# Patient Record
Sex: Female | Born: 1955 | Race: White | Hispanic: No | Smoking: Never smoker
Health system: Southern US, Community
[De-identification: ages and names within clinical notes are randomized; demographics above are authoritative.]

## PROBLEM LIST (undated history)

## (undated) DIAGNOSIS — G4700 Insomnia, unspecified: Secondary | ICD-10-CM

## (undated) DIAGNOSIS — R911 Solitary pulmonary nodule: Secondary | ICD-10-CM

## (undated) DIAGNOSIS — N879 Dysplasia of cervix uteri, unspecified: Secondary | ICD-10-CM

## (undated) DIAGNOSIS — J309 Allergic rhinitis, unspecified: Secondary | ICD-10-CM

## (undated) DIAGNOSIS — E7800 Pure hypercholesterolemia, unspecified: Secondary | ICD-10-CM

## (undated) DIAGNOSIS — N3010 Interstitial cystitis (chronic) without hematuria: Secondary | ICD-10-CM

## (undated) DIAGNOSIS — K589 Irritable bowel syndrome without diarrhea: Secondary | ICD-10-CM

## (undated) DIAGNOSIS — K449 Diaphragmatic hernia without obstruction or gangrene: Secondary | ICD-10-CM

## (undated) DIAGNOSIS — A4902 Methicillin resistant Staphylococcus aureus infection, unspecified site: Secondary | ICD-10-CM

## (undated) DIAGNOSIS — R8789 Other abnormal findings in specimens from female genital organs: Secondary | ICD-10-CM

## (undated) DIAGNOSIS — K259 Gastric ulcer, unspecified as acute or chronic, without hemorrhage or perforation: Secondary | ICD-10-CM

## (undated) DIAGNOSIS — K219 Gastro-esophageal reflux disease without esophagitis: Secondary | ICD-10-CM

## (undated) DIAGNOSIS — K635 Polyp of colon: Secondary | ICD-10-CM

## (undated) DIAGNOSIS — Z860100 Personal history of colon polyps, unspecified: Secondary | ICD-10-CM

## (undated) DIAGNOSIS — I10 Essential (primary) hypertension: Secondary | ICD-10-CM

## (undated) DIAGNOSIS — G5600 Carpal tunnel syndrome, unspecified upper limb: Secondary | ICD-10-CM

## (undated) DIAGNOSIS — N3946 Mixed incontinence: Secondary | ICD-10-CM

## (undated) HISTORY — DX: Dysplasia of cervix uteri, unspecified: N87.9

## (undated) HISTORY — DX: Diaphragmatic hernia without obstruction or gangrene: K44.9

## (undated) HISTORY — DX: Carpal tunnel syndrome, unspecified upper limb: G56.00

## (undated) HISTORY — DX: Insomnia, unspecified: G47.00

## (undated) HISTORY — DX: Other abnormal findings in specimens from female genital organs: R87.89

## (undated) HISTORY — DX: Mixed incontinence: N39.46

## (undated) HISTORY — DX: Pure hypercholesterolemia, unspecified: E78.00

## (undated) HISTORY — DX: Solitary pulmonary nodule: R91.1

## (undated) HISTORY — DX: Personal history of colon polyps, unspecified: Z86.0100

## (undated) HISTORY — DX: Interstitial cystitis (chronic) without hematuria: N30.10

## (undated) HISTORY — DX: Essential (primary) hypertension: I10

## (undated) HISTORY — DX: Gastro-esophageal reflux disease without esophagitis: K21.9

## (undated) HISTORY — DX: Irritable bowel syndrome, unspecified: K58.9

## (undated) HISTORY — DX: Gastric ulcer, unspecified as acute or chronic, without hemorrhage or perforation: K25.9

## (undated) HISTORY — DX: Allergic rhinitis, unspecified: J30.9

## (undated) HISTORY — DX: Personal history of colonic polyps: Z86.010

## (undated) HISTORY — PX: TONSILLECTOMY AND ADENOIDECTOMY: SHX28

## (undated) HISTORY — DX: Methicillin resistant Staphylococcus aureus infection, unspecified site: A49.02

## (undated) HISTORY — PX: CARPAL TUNNEL RELEASE: SHX101

## (undated) HISTORY — DX: Polyp of colon: K63.5

---

## 2000-03-07 HISTORY — PX: ABDOMINAL HYSTERECTOMY: SHX81

## 2000-12-21 ENCOUNTER — Encounter (INDEPENDENT_AMBULATORY_CARE_PROVIDER_SITE_OTHER): Payer: Self-pay

## 2000-12-22 ENCOUNTER — Inpatient Hospital Stay (HOSPITAL_COMMUNITY): Admission: RE | Admit: 2000-12-22 | Discharge: 2000-12-23 | Payer: Self-pay | Admitting: Obstetrics and Gynecology

## 2002-03-21 ENCOUNTER — Encounter: Payer: Self-pay | Admitting: Emergency Medicine

## 2002-03-21 ENCOUNTER — Emergency Department (HOSPITAL_COMMUNITY): Admission: EM | Admit: 2002-03-21 | Discharge: 2002-03-21 | Payer: Self-pay | Admitting: Emergency Medicine

## 2002-05-03 ENCOUNTER — Ambulatory Visit (HOSPITAL_BASED_OUTPATIENT_CLINIC_OR_DEPARTMENT_OTHER): Admission: RE | Admit: 2002-05-03 | Discharge: 2002-05-03 | Payer: Self-pay | Admitting: Urology

## 2004-03-07 DIAGNOSIS — K635 Polyp of colon: Secondary | ICD-10-CM

## 2004-03-07 HISTORY — PX: COLONOSCOPY: SHX174

## 2004-03-07 HISTORY — DX: Polyp of colon: K63.5

## 2004-03-07 LAB — HM COLONOSCOPY

## 2004-09-08 ENCOUNTER — Encounter: Admission: RE | Admit: 2004-09-08 | Discharge: 2004-09-08 | Payer: Self-pay | Admitting: Obstetrics and Gynecology

## 2004-09-16 ENCOUNTER — Encounter: Admission: RE | Admit: 2004-09-16 | Discharge: 2004-09-16 | Payer: Self-pay | Admitting: Obstetrics and Gynecology

## 2005-02-14 ENCOUNTER — Emergency Department (HOSPITAL_COMMUNITY): Admission: EM | Admit: 2005-02-14 | Discharge: 2005-02-14 | Payer: Self-pay | Admitting: Family Medicine

## 2005-03-17 ENCOUNTER — Ambulatory Visit: Payer: Self-pay | Admitting: Gastroenterology

## 2005-03-24 ENCOUNTER — Ambulatory Visit: Payer: Self-pay | Admitting: Internal Medicine

## 2005-04-25 ENCOUNTER — Ambulatory Visit: Payer: Self-pay | Admitting: Internal Medicine

## 2005-06-23 ENCOUNTER — Ambulatory Visit: Payer: Self-pay | Admitting: Internal Medicine

## 2005-08-19 ENCOUNTER — Emergency Department (HOSPITAL_COMMUNITY): Admission: EM | Admit: 2005-08-19 | Discharge: 2005-08-19 | Payer: Self-pay | Admitting: Emergency Medicine

## 2005-08-26 ENCOUNTER — Ambulatory Visit: Payer: Self-pay | Admitting: Cardiology

## 2005-08-30 ENCOUNTER — Ambulatory Visit: Payer: Self-pay | Admitting: Internal Medicine

## 2005-09-01 ENCOUNTER — Ambulatory Visit: Payer: Self-pay

## 2005-09-08 ENCOUNTER — Encounter: Payer: Self-pay | Admitting: Cardiology

## 2005-09-08 ENCOUNTER — Ambulatory Visit: Payer: Self-pay

## 2005-09-12 ENCOUNTER — Ambulatory Visit: Payer: Self-pay | Admitting: Internal Medicine

## 2006-09-04 ENCOUNTER — Ambulatory Visit (HOSPITAL_BASED_OUTPATIENT_CLINIC_OR_DEPARTMENT_OTHER): Admission: RE | Admit: 2006-09-04 | Discharge: 2006-09-04 | Payer: Self-pay | Admitting: *Deleted

## 2007-11-29 ENCOUNTER — Ambulatory Visit: Payer: Self-pay | Admitting: Cardiology

## 2007-11-30 ENCOUNTER — Inpatient Hospital Stay (HOSPITAL_COMMUNITY): Admission: EM | Admit: 2007-11-30 | Discharge: 2007-12-03 | Payer: Self-pay | Admitting: Emergency Medicine

## 2007-11-30 ENCOUNTER — Ambulatory Visit: Payer: Self-pay | Admitting: Cardiology

## 2007-12-05 ENCOUNTER — Ambulatory Visit: Payer: Self-pay | Admitting: Internal Medicine

## 2007-12-05 DIAGNOSIS — K219 Gastro-esophageal reflux disease without esophagitis: Secondary | ICD-10-CM | POA: Insufficient documentation

## 2007-12-05 DIAGNOSIS — K222 Esophageal obstruction: Secondary | ICD-10-CM

## 2007-12-10 ENCOUNTER — Ambulatory Visit: Payer: Self-pay | Admitting: Internal Medicine

## 2008-05-27 ENCOUNTER — Ambulatory Visit: Payer: Self-pay | Admitting: Family Medicine

## 2008-10-08 ENCOUNTER — Encounter: Admission: RE | Admit: 2008-10-08 | Discharge: 2008-12-03 | Payer: Self-pay | Admitting: Family Medicine

## 2009-01-06 ENCOUNTER — Ambulatory Visit: Payer: Self-pay | Admitting: Family Medicine

## 2009-02-04 LAB — HM MAMMOGRAPHY: HM Mammogram: NORMAL

## 2009-03-09 ENCOUNTER — Encounter: Admission: RE | Admit: 2009-03-09 | Discharge: 2009-03-09 | Payer: Self-pay | Admitting: Family Medicine

## 2009-10-21 IMAGING — CR DG CHEST 2 VIEW
2 series · 2 of 2 positions shown · non-contrast
Comparison: None

CLINICAL DATA: Chest and left arm pain, shortness of breath,
weakness, hypertension

CHEST - 2 VIEW

[w chest pa]
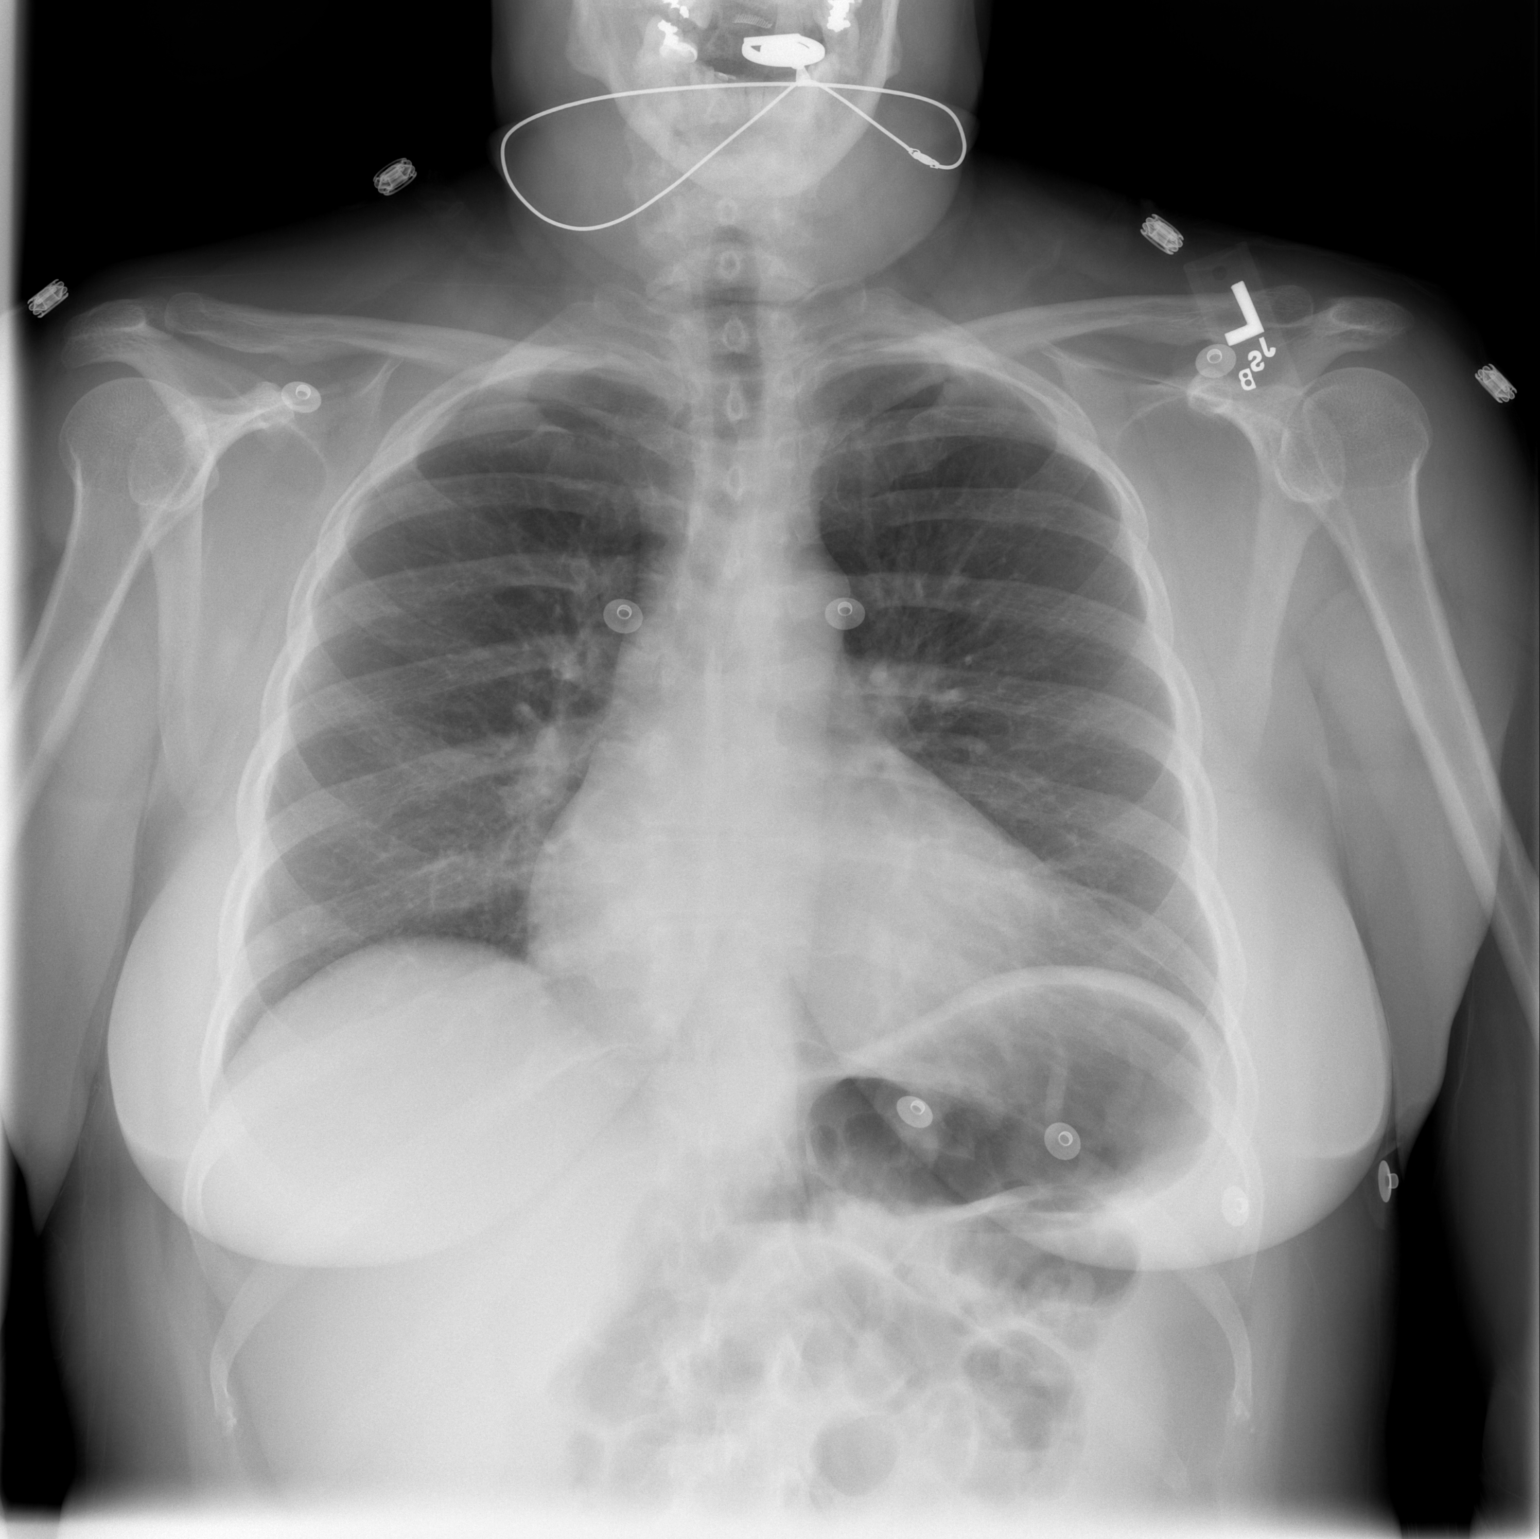

[w chest lat]
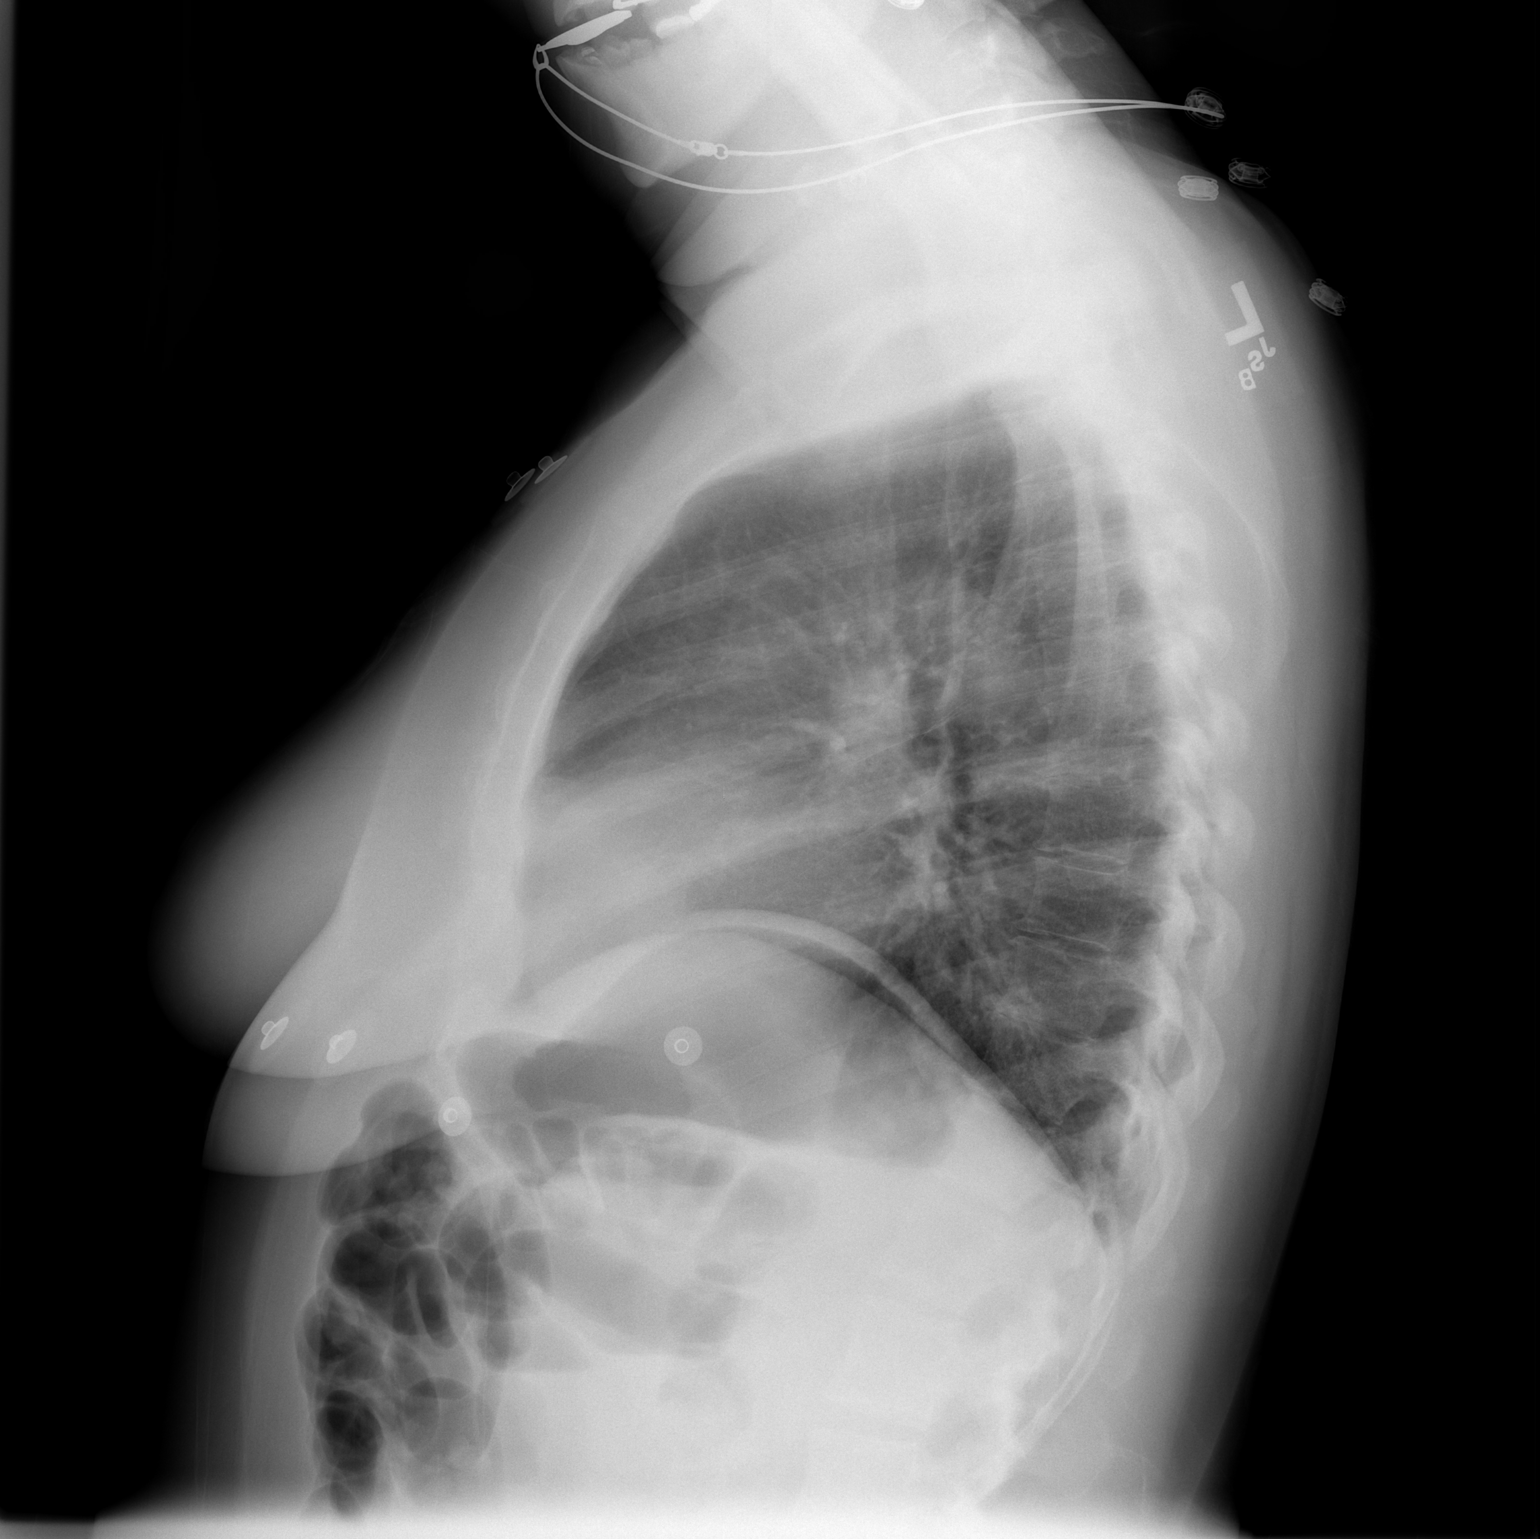

[2 of 2 positions shown; findings below may reference images not displayed]

FINDINGS: Mild cardiac enlargement.
Normal mediastinal contours and pulmonary vascularity.
Lungs clear.
No pleural effusion or pneumothorax.
Bones unremarkable.
IMPRESSION: Mild cardiomegaly.
No acute abnormalities.

## 2010-03-28 ENCOUNTER — Encounter: Payer: Self-pay | Admitting: Internal Medicine

## 2010-03-28 ENCOUNTER — Encounter: Payer: Self-pay | Admitting: Obstetrics and Gynecology

## 2010-03-30 ENCOUNTER — Ambulatory Visit: Payer: Self-pay | Admitting: Cardiology

## 2010-04-07 ENCOUNTER — Encounter: Payer: Self-pay | Admitting: Family Medicine

## 2010-06-28 ENCOUNTER — Other Ambulatory Visit: Payer: Self-pay | Admitting: *Deleted

## 2010-06-28 DIAGNOSIS — I10 Essential (primary) hypertension: Secondary | ICD-10-CM

## 2010-06-28 MED ORDER — LISINOPRIL-HYDROCHLOROTHIAZIDE 20-12.5 MG PO TABS: 1.0000 | ORAL_TABLET | Freq: Every day | ORAL | Status: DC

## 2010-06-28 NOTE — Telephone Encounter (Signed)
escribe medication per fax request  

## 2010-07-20 NOTE — Cardiovascular Report (Signed)
NAMEKAYTELYN, GLORE NO.:  [id number]   MEDICAL RECORD NO.:  [id number]          PATIENT TYPE:  OBV   LOCATION:  3704                         FACILITY:  [facility]   PHYSICIAN:  Verne Carrow, MDDATE OF BIRTH:  [birth year]   DATE OF PROCEDURE:  12/03/2007  DATE OF DISCHARGE:                            CARDIAC CATHETERIZATION   INDICATIONS:  Chest pain.   OPERATOR:  Verne Carrow, MD   PROCEDURE PERFORMED:  1. Left heart catheterization.  2. Selective coronary angiography.  3. Left ventricular angiogram.   FINDINGS:  1. The left main coronary artery bifurcates to the circumflex through      LAD.  There is no disease noted in the left main.  2. The left anterior descending courses to the apex and becomes a      small-caliber vessel near the apex.  There is no angiographic      evidence of disease noted in the LAD.  3. Circumflex had been following gives off several obtuse marginal      branches and is free of disease.  There is moderate size ramus      branch that has no evidence of disease.  4. The right coronary artery is a large dominant vessel that has no      evidence of disease.  5. Left ventricular angiogram shows normal systolic function with no      evidence of wall motion abnormalities.  The ejection fraction is      estimated at 60%.   HEMODYNAMIC FINDINGS:  Left ventricular pressure 140/7, end-diastolic  pressure 17, central aortic pressure 126/64.   DETAILS OF PROCEDURE:  The patient was brought to the Heart  Catheterization Laboratory after signing informed consent for the  procedure.  The right groin was prepped and draped in a sterile fashion.  A 5-French sheath was inserted into the right femoral artery.  Standard  diagnostic catheters were used to engage the left coronary system and  the right coronary artery for selective coronary angiograms.  A 5-French  pigtail catheter was used to cross the aortic valve into the left  ventricle.  Following a left ventricular angiogram, the pigtail catheter  was pulled back across the aortic valve with no significant pressure  gradient measure.  The patient tolerated the procedure well and was  taken to the recovery area where her sheath was removed.  Manual  pressure was applied for hemostasis.   IMPRESSION:  1. No angiographic evidence of coronary artery disease.  2. Normal left ventricular systolic function.  3. Noncardiac etiology of chest pain.   RECOMMENDATIONS:  I do not recommend any further cardiac workup of this  patient's chest pain.  It sounds like it would be most beneficial to  proceed now with a GI workup of her chest pain.      Verne Carrow, MD  Electronically Signed     CM/MEDQ  D:  12/03/2007  T:  12/04/2007  Job:  [id number]

## 2010-07-20 NOTE — H&P (Signed)
Tammy, [name] NO.:  [id number]   Tammy RECORD NO.:  [id number]          PATIENT TYPE:  OBV   LOCATION:  3703                         FACILITY:  [facility]   PHYSICIAN:  Tammy Rotunda, MD, FACCDATE OF BIRTH:  1955-04-17   DATE OF ADMISSION:  11/30/2007  DATE OF DISCHARGE:                              HISTORY & PHYSICAL   PRIMARY CARDIOLOGIST:  [name]. Tammy Som, MD, [name]   PRIMARY CARE PHYSICIAN:  Dr. Reita [name] at [name].   [name] is a delightful 55 year old Caucasian female who I actually  spoke with by phone tonight to the answering service when she called  complaining of chest discomfort.  She had been exercising and  immediately after exercising developed some tightness around her left  breast with numbness down her left arm, some left jaw and neck  discomfort, and her legs felt wobbly.  She had actually been evaluated  in the past and had a stress test done per the patient's report.  I do  not have the report available.  Has seen Dr. Jens [name] a few years ago  with hypertension, mitral valve prolapse/palpitations, and had a stress  test done per the patient's report and then she recently saw Dr. Myrtis [name] at  the request of her primary care physician.  She was experiencing some  chest discomfort last Sunday.  She also had numbness in her left arm and  her left neck.  She saw Dr. Myrtis [name] this week and was scheduled for stress  test at our office on Monday, but tonight after she began to have the  chest discomfort, this alarmed her, and we decided to go ahead and bring  her in and have a stress test done here.  She also had some nausea today  prior to the chest discomfort.  She taken some antacid without relief.  Currently, she is rating her pain as 5 on a scale of 1-10.  She has no  other associated symptoms with chest discomfort.   PAST Tammy HISTORY:  1. Hypertension.  2. Irritable bowel disease.  3. Interstitial cystitis.  4.  Dyslipidemia.  5. Mitral valve prolapse and palpitations.  6. Chest discomfort.  7. Previous stress test per patient's report was okay.   SOCIAL HISTORY:  She lives in [place] with her husband.  She stays at  home and cares for her 43-year-old grandchild.  Denies any tobacco, drug,  or herbal medication use.  She exercises frequently, but states today  was the first time she had exercised in a week.  Tries to follow a heart  healthy diet.   FAMILY HISTORY:  Positive for coronary artery disease, cancer in her  father.  Siblings without known disease.   REVIEW OF SYSTEMS:  Positive for chest pain, nausea, and leg weakness as  described above.  All other systems reviewed and negative.   ALLERGIES:  No known drug allergies.   MEDICATIONS:  1. Metoprolol 25 b.i.d.  2. Hydrochlorothiazide 12.5 daily.  3. Lovastatin 10.  4. Prilosec 20 b.i.d.  5. Multivitamin daily.  6. Aleve p.r.n.  7. Levsin  p.r.n.  8. She takes aspirin 81 mg daily.   PHYSICAL EXAMINATION:  VITAL SIGNS:  Temperature 98.4, heart rate 51 and  regular, respirations 18, and blood pressure 159/92.  GENERAL:  She is in no acute distress, but rating her pain of 5 at this  time.  HEENT:  Unremarkable.  NECK:  Supple without lymphadenopathy.  No bruits.  No JVD.  CARDIOVASCULAR:  Reveals S1 and S2, brady rhythm.  LUNGS:  Clear to auscultation.  SKIN:  Warm and dry.  ABDOMEN:  Soft, nontender.  She has positive bowel sounds.  No rebound  or guarding.  EXTREMITIES:  Lower extremities without clubbing,  cyanosis, or edema.  NEUROLOGIC:  Alert and oriented x3.   Chest x-ray showing mild cardiomegaly.  EKG showing sinus brady at a  rate of 47 without acute ST or T-wave changes.  Lab work baseline.  Point of care is negative.  Potassium 4.1, creatinine 0.8, glucose 97,  hematocrit 39.   The patient with chest pain somewhat typical/atypical features; however,  no objective evidence of ischemia.  No abnormal physical  findings.  Admitted for overnight observation, rule out myocardial infarction.  If  enzymes are negative, schedule exercise Cardiolite in the a.m.  inpatient.  Dr. Rollene [name] has been in to examine and assess, the  patient agrees with plan of care.      [name], ACNP      Tammy Rotunda, MD, [facility]  Electronically Signed    MB/MEDQ  D:  11/30/2007  T:  12/01/2007  Job:  [id number]

## 2010-07-20 NOTE — Discharge Summary (Signed)
NAMEMECKENZIE, BALSLEY NO.:  [id number]   MEDICAL RECORD NO.:  [id number]          PATIENT TYPE:  OBV   LOCATION:  3703                         FACILITY:  [facility]   PHYSICIAN:  Verne Carrow, MDDATE OF BIRTH:  21-Nov-1955   DATE OF ADMISSION:  11/30/2007  DATE OF DISCHARGE:  12/03/2007                               DISCHARGE SUMMARY   PROCEDURES:  1. Adenosine Myoview.  2. Cardiac catheterization.  3. Coronary arteriogram.  4. Left ventriculogram.   PRIMARY FINAL DISCHARGE DIAGNOSIS:  Chest pain, no coronary artery  disease in catheterization.   SECONDARY DIAGNOSES:  1. Hypertension.  2. Irritable bowel syndrome.  3. History of interstitial cystitis.  4. History of dyslipidemia with a total cholesterol of 169,      triglycerides 193, HDL 24, LDL 106 this admission.  5. History of mitral valve prolapse with echocardiogram at 2007      showing trivial mitral valvular regurgitation and normal mitral      valvular stricture, ejection fraction 60-65%, trivial tricuspid      valvular regurgitation also.  6. Obesity with a body mass index of 30.8.  7. Family history of coronary artery disease.   TIME OF DISCHARGE:  31 minutes.   HOSPITAL COURSE:  Ms. Santoni is a 55 year old female with no previous  history of coronary artery disease.  She has been evaluated for chest  pain in the past, having a normal Myoview in 2007.  She was seen in the  office on November 29, 2007 and was scheduled for a stress test.  However, she had recurrent chest pain and came to the hospital where she  was admitted for further evaluation.   Her cardiac enzymes were negative for MI.  An adenosine Myoview was  performed, which showed anterior apical abnormality, felt to be breast  attenuation artifact.  Her EF was 71%.  She was evaluated by Dr.  Dietrich Pates who felt that with recurrent symptoms, cardiac catheterization  was indicated.  This was performed on December 03, 2007.   The cardiac catheterization showed no angiographic evidence of coronary  artery disease.  She had preserved left ventricular function.  Dr.  Clifton James reviewed the films and felt that she had noncardiac chest pain.  He recommended follow up with primary care for noncardiac causes of  chest pain.  Postcath, Ms. Fifer was having no further episodes of  chest pain.  She was having episodes of right hip pain, which she states  is a recurrent problem for her and likely due to osteoarthritis.  Because she needs to avoid NSAIDs for several days after her cath, she  will be placed on Vicodin temporarily and follow up with her primary  care physician, her symptoms did not improve.  Ms. Hutto is pending  completion of her bed rest, but if she ambulates without difficulty  after her bedrest is completed, she will be discharged home on December 03, 2007 in stable condition.  Of note, Ms. Zachery Conch had sinus  bradycardia with heart rates occasionally dropping into the 40s.  There  was concern for this causing some  symptoms and therefore, her beta-  blocker is currently discontinued.  Per the patient, she normally has a  low heart rate and if her heart rate is unchanged, off the beta-blocker,  it can be restarted as an outpatient.   DISCHARGE INSTRUCTIONS:  Her activity level is to be increased gradually  with no driving for 2 days and no lifting for a week.  She is to call  our office for any problems with the cath site.  She is to follow up  with Jacolyn Reedy, PA-C for Dr. Jens Som on December 12, 2007 at 8:45.  She is to follow up with Dr. Katrinka Blazing as needed.  She is to contact Dr.  Marina Goodell with [facility] for an appointment.   DISCHARGE MEDICATIONS:  1. Metoprolol 25 mg b.i.d. is stopped.  2. Lovastatin 10 mg daily.  3. Multivitamin daily.  4. Aspirin 81 mg daily.  5. Motrin p.r.n. is on hold for 48 hours.  6. Ambien 5 mg at bedtime p.r.n.  7. Hydrochlorothiazide 12.5 mg daily.  8. Vicodin 5 mg  1 or 2 tablets t.i.d. p.r.n.      Theodore Demark, PA-C      [facility]ne Carrow, MD  Electronically Signed    RB/MEDQ  D:  12/03/2007  T:  12/03/2007  Job:  [id number]   cc:   Wilhemina Bonito. Marina Goodell, MD  Nilda Simmer, M.D.

## 2010-07-20 NOTE — Assessment & Plan Note (Signed)
[facility]                            CARDIOLOGY OFFICE NOTE   NAME:Goldenstein, Pascal Lux                       MRN:          [id number]  DATE:11/29/2007                            DOB:          1955/04/09    Ms. Wigger is here for cardiology follow-up.  There is no history of  documented coronary disease.  She has had some hypertension.  She saw  Dr. Jens Som last on August 26, 2005.  At that time she appeared to be  stable.  She had a Myoview scan at that time.  The scan showed no  significant abnormalities.  Over the past few days,she has had some  nausea and some chest discomfort.  She had some tingling in her arm.  She was seen at Urgent Care and then in the emergency room.  I do not  have the lab results.  It is my understanding that they were drawn  through urgent care.  In the emergency room, she was felt to be stable.  She is now here for further follow-up.  She has some vague chest  discomfort.  It is not related to inspiration.  She might have slight  tenderness in her chest.   PAST MEDICAL HISTORY:   ALLERGIES:  No known drug allergies.   MEDICATIONS:  Hydrochlorothiazide, aspirin, metoprolol, omeprazole,  lovastatin, and multivitamin.   OTHER MEDICAL PROBLEMS:  See the list below.   REVIEW OF SYSTEMS:  Today, she mentions some slight lightheadedness and  rare palpitations.  She has vague chest discomfort.  Otherwise, her  review of systems is negative.   PHYSICAL EXAMINATION:  Weight is 158 pounds.  Blood pressure is 132/80  with a pulse of 50.  The patient is here with her husband today.  The  patient is oriented to person, time, and place.  Affect is normal.  HEENT reveals no xanthelasma.  She has normal extraocular motion.  There  are no carotid bruits.  There is no jugular venous distention.  Lungs  are clear.  Respiratory effort is not labored.  Cardiac exam reveals an  S1 with an S2.  She has no clicks or significant murmurs.  Her  abdomen  is soft.  She has no significant peripheral edema.   EKG today reveals sinus bradycardia with a rate of 50 and no significant  ST-T wave changes.   Problems include,  1. History of gastroesophageal reflux disease with erosive esophagitis      and peptic strictures in the past.  She has seen Dr. Yancey Flemings      over time.  She is on omeprazole.  She may need further GI      evaluation over time.  2. History of nonsteroidal anti-inflammatory drug-induced ulcer      disease.  3. Irritable bowel syndrome.  4. History of palpitations in the past.  5. History of mitral valve prolapse at some point in the past.  I do      not have recent echo data.  6. History of bradycardia and she still has bradycardia.  7. Current vague chest discomfort.  Etiology is not clear.  She is not      having an acute cardiac event.   We will arrange for a Myoview scan over the next few days and then early  follow-up with Dr. Jens Som.  I will not be changing any of her meds at  this point.     Luis Abed, MD, [facility]  Electronically Signed    JDK/MedQ  DD: 11/29/2007  DT: 11/29/2007  Job #: [id number]   cc:   [facility] [place]

## 2010-07-20 NOTE — Op Note (Signed)
[name], [name] NO.:  [id number]   MEDICAL RECORD NO.:  [id number]           PATIENT TYPE:   LOCATION:                                 FACILITY:   PHYSICIAN:  Tammy [name], M.D.DATE OF BIRTH:   DATE OF PROCEDURE:  09/04/2006  DATE OF DISCHARGE:                               OPERATIVE REPORT   PREOP DIAGNOSIS:  Right carpal tunnel syndrome.   POSTOP DIAGNOSIS:  Right carpal tunnel syndrome.   PROCEDURE:  Decompression median nerve right carpal tunnel.   SURGEON:  Lowell Bouton, M.D.   ANESTHESIA:  Marcaine 1/2%, local with sedation.   OPERATIVE FINDINGS:  The patient had no masses in the carpal canal.  The  motor branch of the nerve was intact.   DESCRIPTION OF PROCEDURE:  Under 1/2% Marcaine local anesthesia with a  tourniquet on the right arm, the right hand was prepped and draped in  the usual fashion.  After exsanguinating the limb the tourniquet was  inflated to 250 mmHg.  A 3-cm, longitudinal incision was made in the  palm just ulnar to the thenar crease; then carried through the  subcutaneous tissues.  Blunt dissection was carried through the  superficial palmar fascia distal to the transverse carpal ligament.  A  hemostat was then placed in the carpal canal up against the hook of the  hamate; and the transverse carpal ligament was divided on the ulnar  border of the median nerve.  The proximal end of the ligament was  divided with the scissors after dissecting the nerve away from the  undersurface of the ligament.  The carpal canal was then palpated and  was found to be adequately decompressed.  The nerve was examined; and  the motor branch was identified.   The wound was then irrigated copiously with saline.  The skin was closed  with 4-0 nylon sutures.  Sterile dressings were applied followed by a  volar wrist splint.  The patient tolerated the procedure well and went  to the recovery room; awake and stable in stable  condition.      Lowell Bouton, M.D.  Electronically Signed     EMM/MEDQ  D:  09/04/2006  T:  09/04/2006  Job:  [id number]

## 2010-07-23 NOTE — Discharge Summary (Signed)
[facility]  Patient:    Tammy [name], Tammy [name] Visit Number: [id number] MRN: [id number]          Service Type: DSU Location: [id number] Attending Physician:  Michaele Offer Dictated by:   Zenaida Niece, M.D. Admit Date:  12/21/2000 Discharge Date: 12/23/2000                             Discharge Summary  ADMISSION DIAGNOSES:          1. Abnormal uterine bleeding.                               2. Endometrial lesion.                               3. Leiomyomatous uterus.  DISCHARGE DIAGNOSES:          1. Leiomyomatous uterus.                               2. Simple endometrial hyperplasia.                               3. Benign endometrial polyp.  PROCEDURE:                    Transvaginal hysterectomy.  CONSULTATIONS:                None.  COMPLICATIONS:                None.  HISTORY AND PHYSICAL:         Please see the chart for the full history and physical.  Briefly, this is a 55 year old white female, para 2-0-0-2, with regular menses but with intermenstrual bleeding and postcoital bleeding with known fibroids associated with dyspareunia and some abdominal pain. Ultrasound reveals an endometrial lesion and she desires definitive therapy.  Physical exam was significant for a benign abdomen and on pelvic exam, she had a slightly enlarged irregular uterus, mostly on the right, consistent with fibroids.  HOSPITAL COURSE:              Patient was admitted on October 17th and underwent a vaginal hysterectomy with cystoscopy under general anesthesia. Estimated blood loss was 200 cc.  She had an enlarged uterus consistent with leiomyomas and normal tubes and ovaries.  By cystoscopy, she had patent ureters.  Postoperatively, she did fairly well.  She felt very weak on postoperative day #1 and felt she could not go home due to this.  She had no nausea and vomiting and good pain control.  Preoperative hemoglobin was 12.2, postoperative  was 9.3, which rose to 9.9 on the afternoon of postoperative day #1.  She was able to ambulate and tolerated a regular diet.  On the morning of postoperative day #2, she was felt to be stable enough for discharge home.  CONDITION ON DISCHARGE:       Stable.  DISPOSITION:                  Discharged to home.  DISCHARGE INSTRUCTIONS:       Her diet is a regular diet, her activity is pelvic rest, no strenuous activity and no driving.  FOLLOWUP:                     Followup is in six weeks. DISCHARGE MEDICATIONS:        Percocet p.r.n. pain and over-the-counter Aleve prn. Dictated by:   Zenaida Niece, M.D. Attending Physician:  Michaele Offer DD:  12/23/00 TD:  12/26/00 Job: [id number]

## 2010-07-23 NOTE — H&P (Signed)
[facility]  Patient:    Tammy [name], Tammy [name] Visit Number: [id number] MRN: [id number]          Service Type: Attending:  Zenaida Niece, M.D. Dictated by:   Zenaida Niece, M.D. Adm. Date:  12/21/00                           History and Physical  CHIEF COMPLAINT:              Abnormal uterine bleeding with intermenstrual bleeding and postcoital bleeding, dyspareunia, and leiom1yomatous uterus.  HISTORY OF PRESENT ILLNESS:   This is a 55 year old white female, gravida 2, para 2-0-0-2, whom I saw on August 21. The patient said that she has regular menses every month with occasional intermenstrual bleeding and postcoital bleeding at least every month. She has known fibroids and has had the abnormal bleeding for at least three or four months. She also complained of deep dyspareunia which was worse with rear entry and varied. She says that she can have some extreme abdominal pain with bowel movements and that this is worse during her menses. Exam revealed a slightly enlarged, irregular uterus, mostly on the right, consistent with leiomyomas. On September 3 she had a pelvic ultrasound which revealed a slightly enlarged uterus with a 3.7 x 3 cm posterior leiomyoma which was tender. The right ovary was normal, left ovary was not seen. With saline infusion, there was noted to be a small fundal endometrial lesion consistent with a polyp. She had colposcopy performed at the same time due to a history of abnormal Pap smears, most recently being ASCUS in July of 2002 with negative human papillomavirus. Colposcopy revealed some acetowhite areas but biopsies revealed only squamous metaplasia with acute and chronic cervicitis and reactive epithelial changes. She was treated with MetroGel for this. Due to the patients abnormal bleeding and fibroids, she wishes to proceed with definitive surgery and is admitted for this at this time.  PAST OBSTETRICAL HISTORY:      In 1978 and 1980, vaginal deliveries at term, 6 pounds 9 ounces and 7 pounds 0 ounces without complications.  PAST MEDICAL HISTORY:         She has mitral valve prolapse with mitral regurgitation and tricuspid regurgitation. She has interstitial cystitis, a history of peptic ulcer disease, hemorrhoids, and irritable bowel syndrome.  PAST SURGICAL HISTORY:        Tonsillectomy and adenoidectomy, ganglion cyst removal from her wrist and wisdom teeth removal.  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  ALLERGIES:                    None known.  MEDICATIONS:                  Zantac q.d. to b.i.d. as needed, Restoril p.r.n. and occasional Xanax p.r.n.  FAMILY HISTORY:               No gynecological or colon cancer.  SOCIAL HISTORY:               Patient is married and denies alcohol, tobacco, or drug use.  PHYSICAL EXAMINATION:  VITAL SIGNS:                  Weight is 148 pounds. Pulse is 70, blood pressure 142/80.  GENERAL:  Well-developed, well-nourished white female who is in no acute distress.  HEENT:                        Pupils are equally round and reactive to light and accommodation. Extraocular muscles are intact. Oropharynx is clear without erythema or exudates.  NECK:                         Supple without lymphadenopathy or thyromegaly.  LUNGS:                        Clear to auscultation.  HEART:                        Regular rate and rhythm without murmur.  BREAST:                       Breast exam in the sitting and supine position reveals no dominant masses, adenopathies, skin change, or nipple discharge.  ABDOMEN:                      Soft, nontender, nondistended without palpable masses.  EXTREMITIES:                  No edema, are nontender, and DTRS are 2/4 and symmetric.  PELVIC:                       External genitalia reveals no  lesions. Speculum exam reveals  no lesions and a normal cervix. Again, on bimanual exam she has a slightly enlarged irregular uterus which is mostly on the right consistent with leiomyomas. She has no adnexal masses.  ASSESSMENT:                   Abnormal uterine bleeding with an endometrial lesion and a uterine leiomyoma. The patient wishes to proceed with definitive surgical therapy. Risks of surgery including bleeding, infection, and damage to the surrounding organs have been discussed with the patient and she agrees to proceed. The patient wishes to preserve her ovaries unless they abnormal.  PLAN:                         Admit the patient on the day of surgery for a vaginal hysterectomy with possible bilateral salpingo-oophorectomy and cystoscopy. Dictated by:   Zenaida Niece, M.D. Attending:  Zenaida Niece, M.D. DD:  12/20/00 TD:  12/20/00 Job: [id number]

## 2010-07-23 NOTE — Op Note (Signed)
NAME:  Tammy [name], Tammy [name]                          ACCOUNT NO.:  [id number]   MEDICAL RECORD NO.:  [id number]                   PATIENT TYPE:  AMB   LOCATION:  [facility]                                 FACILITY:  [facility]   PHYSICIAN:  Jamison Neighbor, M.D.               DATE OF BIRTH:  [birth year]   DATE OF PROCEDURE:  05/03/2002  DATE OF DISCHARGE:                                 OPERATIVE REPORT   PREOPERATIVE DIAGNOSIS:  Interstitial cystitis.   POSTOPERATIVE DIAGNOSIS:  Interstitial cystitis.   PROCEDURE:  1. Cystoscopy.  2. Urethral calibration.  3. Hydrodistention of the bladder.  4. Marcaine and Pyridium instillation.  5. Marcaine and Kenalog injection.   SURGEON:  Jamison Neighbor, M.D.   ANESTHESIA:  General.   COMPLICATIONS:  None.   DRAINS:  None.   BRIEF HISTORY:  This 55 year old female is felt to have suffered from  interstitial cystitis.  Dr. __________made a diagnosis many years ago.  The  patient was evaluated with cystoscopy under anesthesia and underwent  Clorpactin instillation, and she did reasonably well until recently when her  symptoms returned.  The patient has increasing urgency, frequency, and pain  and generally feels that she is ill and out of sorts.  The patient has no  real evidence for infection and has not responded to antibiotic therapy.  The patient also notes that she has had some problem with stress  incontinence and has been told that she might need a pubovaginal sling.  We  note that the incontinence predates the hysterectomy, but a urodynamic  evaluation to determine if this is true stress or urge incontinence has not  been obtained.  The patient is interested in undergoing diagnostic and  therapeutic cystoscopy to see if she will have some improvement in her  symptoms and to assess her interstitial cystitis.  Pending results of that  therapy, she can be started on appropriate medical management.  Decision as  to whether she should undergo  surgery for stress incontinence can be made  following this cystoscopy and after urodynamic assessment.  The patient  understands the risks and benefits of the procedure and gave full and  informed consent.   DESCRIPTION OF PROCEDURE:  After the successful induction of general  anesthesia, the patient was placed in the dorsal lithotomy position, prepped  with Betadine, and draped in the usual sterile fashion.  Bilateral pelvic  exam reveals minimal cystocele, slight urethral immobility, no significant  rectocele, and no masses on bimanual exam.  There was no evidence of  urethral diverticulum or other abnormalities of the urethra.  The urethra  was calibrated to 22 [place] with female urethral sounds with no evidence of  stenosis or stricture.  The cystoscope was inserted, and the bladder was  carefully inspected.  It was free of any tumor or stones.  Both ureteral  orifices were normal in configuration and location.  There was a modest  degree of physiologic squamous metaplasia which was completely unremarkable.  Urine coming from each ureter was clear.  Distention of the bladder at 100  cm of water was performed for five minutes.  When the bladder was drained,  the bladder capacity was approximately 475 mL which is markedly diminished.  There was terminal bleeding and ulcer formation consistent with interstitial  cystitis.  True, full-fledged Hunner's ulcers were not really seen.  The  bladder was drained.  A mixture of Marcaine and Pyridium was left within the  bladder.  Marcaine and Kenalog were injected periurethrally.  The patient  had received a B&O suppository at the initiation of the procedure and also  received intraoperative Toradol and Zofran in trying to cut down on  postoperative pain.  She will be sent home with Lorcet Plus, Pyridium Plus,  Ditropan, and Levaquin and will return to the office follow-up.  Once that  she has returned, we will offer her instillation therapy  and/or oral therapy  which will consist of Elmiron, Atarax, and possible use of low dose Elavil  or Neurontin.  The patient will make a decision as to whether she needs any  further evaluation of her incontinence once she has been started on IC type  therapy.                                               Jamison Neighbor, M.D.    RJE/MEDQ  D:  05/03/2002  T:  05/03/2002  Job:  [id number]   cc:   Zenaida Niece, M.D.  [address]  [place]  [place] [address]  Fax: [phone]

## 2010-07-23 NOTE — Op Note (Signed)
[facility]  Patient:    Tammy [name], Tammy [name] Visit Number: [id number] MRN: [id number]          Service Type: DSU Location: [id number] Attending Physician:  Michaele Offer Proc. Date: 12/21/00 Admit Date:  12/21/2000 Discharge Date: 12/23/2000                             Operative Report  PREOPERATIVE DIAGNOSIS:       Abnormal uterine bleeding.  Leiomyomatous uterus.  POSTOPERATIVE DIAGNOSIS:      Abnormal uterine bleeding.  Leiomyomatous uterus.  OPERATION:                    Transvaginal hysterectomy and cystoscopy.  SURGEON:                      Zenaida Niece, M.D.  ASSISTANT:                    Malachi Pro. Ambrose Mantle, M.D.  ANESTHESIA:                   General endotracheal tube.  ESTIMATED BLOOD LOSS:         200 cc.  FINDINGS:                     Enlarged uterus consistent with leiomyomas with normal tubes and ovaries and she had patent ureters by cystoscopy.  DESCRIPTION OF PROCEDURE:     The patient was taken to the operating room and placed in the dorsal supine position.  General anesthesia was induced and she was placed in mobile stirrups.  Perineum and vagina were then prepped and draped in the usual sterile fashion and her bladder drained with a red rubber catheter.  A weighted speculum was inserted into the vagina and the cervix was grasped with Christella Hartigan tenaculum.  The cervicovaginal mucosa was then infiltrated with a dilute solution of Pitressin.  The cervicovaginal mucosa was then incised with electrocautery.  The posterior cul-de-sac was grasped and entered sharply and a Bonanno speculum placed in the posterior cul-de-sac.  The bladder was then dissected off anteriorly, the anterior peritoneum identified and entered.  A Deaver retractor was then used to retract the bladder anteriorly.  The uterosacral ligaments were clamped, transected, and ligated with #1 chromic and tagged for later use.  The uterine arteries,  cardinal ligaments, and broad ligaments were also clamped, transected, and ligated with #1 chromic.  The uterus with some difficulty was delivered through the incision and the utero-ovarian pedicles were clamped, transected, and doubly ligated with #1 chromic and tagged for observation.  Both tubes and ovaries were inspected and found to be normal.  All sites were inspected and found to be hemostatic.  The uterosacral ligaments were then plicated in the midline with one interrupted suture of 2-0 silk.  The peritoneum was run to the vaginal cuff with running locking 0 Vicryl to aid in hemostasis.  The previously tagged uterosacral pedicles were then also tied in the midline for added support.  The vagina was then closed with a running locking 2-0 Vicryl. The patient was given Indigo-Carmine IV.  The vaginal cuff was found to be hemostatic and closed adequately.  The bladder was drained and 200 cc of water was instilled.  Inspection with the cystoscope then revealed patent ureters bilaterally with Indigo Carmine seen to come from both ureteral  orifices. There was no injury to the bladder.  The cystoscope was removed and a Foley catheter inserted and the bladder drained.  The patient was taken down from stirrups.  She was extubated in the operating room, tolerated the procedure well, and was taken to the recovery room in stable condition.  The patient was given Ancef 1 gram prophylaxis and counts were correct x 2. Attending Physician:  Michaele Offer DD:  01/06/01 TD:  01/08/01 Job: [id number]

## 2010-08-30 ENCOUNTER — Other Ambulatory Visit: Payer: Self-pay | Admitting: *Deleted

## 2010-08-30 DIAGNOSIS — E785 Hyperlipidemia, unspecified: Secondary | ICD-10-CM

## 2010-08-31 ENCOUNTER — Other Ambulatory Visit: Payer: Self-pay | Admitting: *Deleted

## 2010-08-31 ENCOUNTER — Telehealth: Payer: Self-pay | Admitting: Cardiology

## 2010-08-31 DIAGNOSIS — E7800 Pure hypercholesterolemia, unspecified: Secondary | ICD-10-CM

## 2010-08-31 NOTE — Telephone Encounter (Signed)
Called today really confused about what her and her husband are supposed to do next.  The last time they were both here was on March 29, 2010 and in the OV note Dr. [place] said that they should both resume exercise regiments, lose weight and return in a year.  But at the end of her husbands OV note he said pending his LAB results. Tammy [name] called Jul 07, 2010 between 3:20 and 3:30 about her husband and for some reason I scheduled her husband to come in on 10/14/10 at 11:15 and removed his recall notice, but, I left her profile alone. Tammy [name] called today asking if she had an appointment to have fasting labs done, which she did, and that she was on vacation for the past few weeks and just got the notification this morning.  I went ahead and canceled her appointment today and was highly confused because there was no note saying that she needed that appointment anywhere. Please call back to clear the air. I have pulled both her and her husbands charts.

## 2010-08-31 NOTE — Telephone Encounter (Signed)
Called wanting to verify having labs and office visit both for her and husband. Scheduled her for fasting labs 7/10 and will put in recalls for 03/2011. Her husband Prentiss Aeronautical engineer) is seeing his PCP (Dr. Merla Riches) in August and will have him send labs to [place].

## 2010-09-13 ENCOUNTER — Telehealth: Payer: Self-pay | Admitting: Cardiology

## 2010-09-13 NOTE — Telephone Encounter (Signed)
Needs both her and her husband's lab blood written in a prescription type format before the [facility] with do their blood work.

## 2010-09-14 ENCOUNTER — Other Ambulatory Visit: Payer: Self-pay | Admitting: Family Medicine

## 2010-09-14 ENCOUNTER — Other Ambulatory Visit: Payer: Self-pay | Admitting: *Deleted

## 2010-09-14 DIAGNOSIS — Z1231 Encounter for screening mammogram for malignant neoplasm of breast: Secondary | ICD-10-CM

## 2010-09-20 ENCOUNTER — Ambulatory Visit
Admission: RE | Admit: 2010-09-20 | Discharge: 2010-09-20 | Disposition: A | Discharge status: Home / Self Care | Payer: BC Managed Care – PPO | Source: Ambulatory Visit | Attending: Family Medicine | Admitting: Family Medicine

## 2010-09-20 ENCOUNTER — Inpatient Hospital Stay: Admission: RE | Admit: 2010-09-20 | Payer: Self-pay | Source: Ambulatory Visit

## 2010-09-20 DIAGNOSIS — Z1231 Encounter for screening mammogram for malignant neoplasm of breast: Secondary | ICD-10-CM

## 2010-09-23 ENCOUNTER — Ambulatory Visit: Payer: Self-pay

## 2010-09-28 ENCOUNTER — Telehealth: Payer: Self-pay | Admitting: Cardiology

## 2010-09-28 NOTE — Telephone Encounter (Signed)
CALL PT REGARDING LABWORK.

## 2010-09-29 NOTE — Telephone Encounter (Signed)
lm

## 2010-10-04 NOTE — Telephone Encounter (Signed)
Called wanting to know if we had received labs from urgent care. Advised had not received yet. States were sent over this AM 7/30.

## 2010-10-06 ENCOUNTER — Encounter: Payer: Self-pay | Admitting: Internal Medicine

## 2010-12-06 LAB — CBC
HCT: 38.3
Hemoglobin: 12.9
Hemoglobin: 13
MCHC: 33.7
MCHC: 34
MCV: 90.4
Platelets: 280
RBC: 4.23
RDW: 13.3
WBC: 5.7

## 2010-12-06 LAB — URINALYSIS, ROUTINE W REFLEX MICROSCOPIC
Bilirubin Urine: NEGATIVE
Glucose, UA: NEGATIVE
Hgb urine dipstick: NEGATIVE
Ketones, ur: NEGATIVE
Nitrite: NEGATIVE
pH: 7.5

## 2010-12-06 LAB — POCT I-STAT, CHEM 8
BUN: 15
Creatinine, Ser: 0.8
Hemoglobin: 13.3
Potassium: 4.1
Sodium: 141

## 2010-12-06 LAB — URINE MICROSCOPIC-ADD ON

## 2010-12-06 LAB — HEPATIC FUNCTION PANEL
ALT: 18
Albumin: 3.8
Alkaline Phosphatase: 66
Total Bilirubin: 1.2

## 2010-12-06 LAB — CARDIAC PANEL(CRET KIN+CKTOT+MB+TROPI)
Relative Index: INVALID
Relative Index: INVALID
Total CK: 35
Troponin I: 0.01
Troponin I: <0.01

## 2010-12-06 LAB — DIFFERENTIAL
Basophils Absolute: 0
Basophils Relative: 1
Eosinophils Absolute: 0.2
Eosinophils Relative: 3
Lymphocytes Relative: 43
Lymphs Abs: 2.4
Monocytes Absolute: 0.5
Monocytes Relative: 9
Neutro Abs: 2.6
Neutrophils Relative %: 45

## 2010-12-06 LAB — POCT CARDIAC MARKERS
CKMB, poc: <1 — ABNORMAL LOW
Myoglobin, poc: 47.9
Troponin i, poc: <0.05

## 2010-12-06 LAB — D-DIMER, QUANTITATIVE: D-Dimer, Quant: 0.24

## 2010-12-06 LAB — BASIC METABOLIC PANEL
BUN: 16
CO2: 24
Calcium: 9.3
Creatinine, Ser: 0.71
GFR calc non Af Amer: >60
Glucose, Bld: 165 — ABNORMAL HIGH

## 2010-12-06 LAB — CK TOTAL AND CKMB (NOT AT ARMC)
CK, MB: 0.9
Relative Index: INVALID
Total CK: 60

## 2010-12-06 LAB — LIPID PANEL
HDL: 24 — ABNORMAL LOW
Total CHOL/HDL Ratio: 7
VLDL: 39

## 2010-12-06 LAB — TROPONIN I: Troponin I: <0.01

## 2010-12-06 LAB — APTT: aPTT: 22 — ABNORMAL LOW

## 2010-12-12 ENCOUNTER — Observation Stay (HOSPITAL_COMMUNITY)
Admission: EM | Admit: 2010-12-12 | Discharge: 2010-12-13 | DRG: 125 | Disposition: A | Discharge status: Home / Self Care | Payer: BC Managed Care – PPO | Attending: Cardiology | Admitting: Cardiology

## 2010-12-12 ENCOUNTER — Emergency Department (HOSPITAL_COMMUNITY): Payer: BC Managed Care – PPO

## 2010-12-12 DIAGNOSIS — Z23 Encounter for immunization: Secondary | ICD-10-CM | POA: Insufficient documentation

## 2010-12-12 DIAGNOSIS — K219 Gastro-esophageal reflux disease without esophagitis: Secondary | ICD-10-CM | POA: Insufficient documentation

## 2010-12-12 DIAGNOSIS — Z01812 Encounter for preprocedural laboratory examination: Secondary | ICD-10-CM | POA: Insufficient documentation

## 2010-12-12 DIAGNOSIS — D649 Anemia, unspecified: Secondary | ICD-10-CM | POA: Insufficient documentation

## 2010-12-12 DIAGNOSIS — Z8249 Family history of ischemic heart disease and other diseases of the circulatory system: Secondary | ICD-10-CM | POA: Insufficient documentation

## 2010-12-12 DIAGNOSIS — R0789 Other chest pain: Principal | ICD-10-CM | POA: Insufficient documentation

## 2010-12-12 DIAGNOSIS — I10 Essential (primary) hypertension: Secondary | ICD-10-CM | POA: Insufficient documentation

## 2010-12-12 DIAGNOSIS — R079 Chest pain, unspecified: Secondary | ICD-10-CM

## 2010-12-12 DIAGNOSIS — E785 Hyperlipidemia, unspecified: Secondary | ICD-10-CM | POA: Insufficient documentation

## 2010-12-12 DIAGNOSIS — R0609 Other forms of dyspnea: Secondary | ICD-10-CM | POA: Insufficient documentation

## 2010-12-12 DIAGNOSIS — Z01818 Encounter for other preprocedural examination: Secondary | ICD-10-CM | POA: Insufficient documentation

## 2010-12-12 DIAGNOSIS — I498 Other specified cardiac arrhythmias: Secondary | ICD-10-CM | POA: Insufficient documentation

## 2010-12-12 DIAGNOSIS — R002 Palpitations: Secondary | ICD-10-CM | POA: Insufficient documentation

## 2010-12-12 DIAGNOSIS — G4733 Obstructive sleep apnea (adult) (pediatric): Secondary | ICD-10-CM | POA: Insufficient documentation

## 2010-12-12 DIAGNOSIS — Z01810 Encounter for preprocedural cardiovascular examination: Secondary | ICD-10-CM | POA: Insufficient documentation

## 2010-12-12 DIAGNOSIS — R0989 Other specified symptoms and signs involving the circulatory and respiratory systems: Secondary | ICD-10-CM | POA: Insufficient documentation

## 2010-12-12 LAB — DIFFERENTIAL
Basophils Absolute: 0 K/uL (ref 0.0–0.1)
Basophils Relative: 0 % (ref 0–1)
Eosinophils Absolute: 0.3 K/uL (ref 0.0–0.7)
Eosinophils Relative: 3 % (ref 0–5)
Lymphocytes Relative: 36 % (ref 12–46)
Lymphs Abs: 2.9 K/uL (ref 0.7–4.0)
Monocytes Absolute: 0.6 K/uL (ref 0.1–1.0)
Monocytes Relative: 8 % (ref 3–12)
Neutro Abs: 4.1 K/uL (ref 1.7–7.7)
Neutrophils Relative %: 53 % (ref 43–77)

## 2010-12-12 LAB — CK TOTAL AND CKMB (NOT AT ARMC)
CK, MB: 2.6 ng/mL (ref 0.3–4.0)
Relative Index: INVALID (ref 0.0–2.5)
Total CK: 84 U/L (ref 7–177)

## 2010-12-12 LAB — POCT I-STAT TROPONIN I
Troponin i, poc: 0 ng/mL (ref 0.00–0.08)
Troponin i, poc: 0 ng/mL (ref 0.00–0.08)

## 2010-12-12 LAB — CARDIAC PANEL(CRET KIN+CKTOT+MB+TROPI)
CK, MB: 2.3 ng/mL (ref 0.3–4.0)
CK, MB: 2.3 ng/mL (ref 0.3–4.0)
Relative Index: INVALID (ref 0.0–2.5)
Relative Index: INVALID (ref 0.0–2.5)
Total CK: 63 U/L (ref 7–177)
Total CK: 61 U/L (ref 7–177)
Troponin I: <0.3 ng/mL (ref <0.30)

## 2010-12-12 LAB — CBC
HCT: 33.5 % — ABNORMAL LOW (ref 36.0–46.0)
Hemoglobin: 11.3 g/dL — ABNORMAL LOW (ref 12.0–15.0)
MCH: 29.4 pg (ref 26.0–34.0)
MCV: 87 fL (ref 78.0–100.0)
RBC: 3.85 MIL/uL — ABNORMAL LOW (ref 3.87–5.11)
WBC: 7.8 10*3/uL (ref 4.0–10.5)

## 2010-12-12 LAB — BASIC METABOLIC PANEL
BUN: 20 mg/dL (ref 6–23)
Chloride: 103 mEq/L (ref 96–112)
GFR calc Af Amer: >90 mL/min (ref >90)
GFR calc non Af Amer: >90 mL/min (ref >90)
Potassium: 3.8 mEq/L (ref 3.5–5.1)
Sodium: 141 mEq/L (ref 135–145)

## 2010-12-12 LAB — PROTIME-INR
INR: 0.89 (ref 0.00–1.49)
Prothrombin Time: 12.2 s (ref 11.6–15.2)

## 2010-12-12 LAB — POCT I-STAT, CHEM 8
BUN: 17 mg/dL (ref 6–23)
Calcium, Ion: 1.21 mmol/L (ref 1.12–1.32)
Chloride: 105 meq/L (ref 96–112)
Creatinine, Ser: 0.7 mg/dL (ref 0.50–1.10)
Glucose, Bld: 105 mg/dL — ABNORMAL HIGH (ref 70–99)
HCT: 35 % — ABNORMAL LOW (ref 36.0–46.0)
Hemoglobin: 11.9 g/dL — ABNORMAL LOW (ref 12.0–15.0)
Potassium: 3.7 meq/L (ref 3.5–5.1)
Sodium: 142 meq/L (ref 135–145)
TCO2: 26 mmol/L (ref 0–100)

## 2010-12-12 LAB — TSH: TSH: 1.12 u[IU]/mL (ref 0.350–4.500)

## 2010-12-12 LAB — MRSA PCR SCREENING: MRSA by PCR: NEGATIVE

## 2010-12-13 LAB — LIPID PANEL
Cholesterol: 158 mg/dL (ref 0–200)
HDL: 35 mg/dL — ABNORMAL LOW (ref >39)

## 2010-12-22 LAB — BASIC METABOLIC PANEL
CO2: 27
Calcium: 9
Chloride: 107
GFR calc Af Amer: >60
Sodium: 139

## 2010-12-22 LAB — POCT HEMOGLOBIN-HEMACUE
Hemoglobin: 12.7
Operator id: 123881

## 2010-12-29 NOTE — Cardiovascular Report (Signed)
  NAME:  Tammy [name], CHOHAN NO.:  [id number]  MEDICAL RECORD NO.:  [id number]  LOCATION:  2503                         FACILITY:  [facility]  PHYSICIAN:  Marca Ancona, MD      DATE OF BIRTH:  Aug 31, 1955  DATE OF PROCEDURE:  12/13/2010 DATE OF DISCHARGE:  12/13/2010                           CARDIAC CATHETERIZATION   PROCEDURE:  Coronary angiography.  INDICATION:  This is a 55 year old who presented with chest pain concerning for unstable angina.  She was set up for heart catheterization today.  PROCEDURE NOTE:  After informed consent was obtained, the patient underwent Allen's testing on her right wrist.  The right radial artery gave good collateral circulation to the radial side of the hand.  The patient's right wrist was then sterilely prepped and draped.  Lidocaine 1% was used to locally anesthetize the right radial area.  The right radial artery was entered using modified Seldinger technique and a 5- French arterial sheath was placed.  The right coronary artery was engaged with the JR-4 catheter.  The left coronary artery was engaged using the JL3.0 guide catheter.  Left ventriculography was not done. The patient was having some radial artery spasm and therefore we stopped the procedure.  FINDINGS: 1. Right coronary artery.  The right coronary artery was a dominant     vessel.  No angiographic coronary disease. 2. Left main.  The left main had no angiographic coronary disease. 3. Left circumflex system.  There was a moderate sized ramus and there     was no angiographic coronary disease in the circumflex system. 4. LAD system.  There was a large first diagonal.  There was no     angiographic coronary disease in the LAD system.  IMPRESSION:  No angiographic coronary disease.  The chest pain was noncardiac.     Marca Ancona, MD     DM/MEDQ  D:  12/13/2010  T:  12/13/2010  Job:  [id number]  cc:   Reita Chard, MD  Electronically Signed by Marca Ancona  MD on 12/29/2010 08:09:57 PM

## 2010-12-29 NOTE — Discharge Summary (Signed)
  NAME:  Tammy [name], Tammy [name] NO.:  [id number]  MEDICAL RECORD NO.:  [id number]  LOCATION:  2503                         FACILITY:  [facility]  PHYSICIAN:  Marca Ancona, MD      DATE OF BIRTH:  09-17-1955  DATE OF ADMISSION:  12/12/2010 DATE OF DISCHARGE:  12/13/2010                              DISCHARGE SUMMARY   PROCEDURES: 1. Cardiac catheterization. 2. Coronary arteriogram. 3. Left ventriculogram.  PRIMARY FINAL DISCHARGE DIAGNOSIS:  Chest pain, no coronary artery disease by catheterization.  SECONDARY DIAGNOSIS: 1. History of interstitial cystitis. 2. History of tachy palpitations. 3. Irritable bowel syndrome. 4. Obstructive sleep apnea. 5. Gastroesophageal reflux disease. 6. Status post bladder surgery, ganglion cyst surgery, and     hysterectomy for cervical dysplasia.  TIME AT DISCHARGE:  31 minutes.  HOSPITAL COURSE:  [name] is a 55 year old female with a history of chest pain, but not coronary artery disease.  She had chest pain and came to the hospital where she was admitted for further evaluation.  Her cardiac enzymes were negative for MI.  A previous Myoview (under medical record [id number]) showed possible breast attenuation versus anterior ischemia.  A catheterization was performed at that time showing no CAD.  Since she had a previously abnormal Myoview, it was felt that the best test was a cardiac catheterization.  This was scheduled for December 13, 2010.  The cardiac catheterization showed no CAD and her EF was normal.  No further cardiac workup was felt indicated.  Post catheterization, she is ambulating without chest pain or shortness of breath.  She is considered stable for discharge on December 13, 2010, in improved condition.  DISCHARGE INSTRUCTIONS:  Her activity level is to be increased gradually with no lifting with her right arm for a week, and she is not to drive for a day.  Further instructions are per the radial cath  discharge instruction sheet.  She is to follow up with Dr. Jens Som as needed or as scheduled.  She is to follow up with primary care and call for an appointment.  DISCHARGE MEDICATIONS: 1. Lisinopril 10 mg one half tab daily. 2. Fish oil OTC daily. 3. Ambien 10 mg 1/2 to 1 tablet at bedtime p.r.n. as prior to     admission. 4. Toprol XL 25 mg at bedtime p.r.n. 5. Lovastatin 20 mg at bedtime. 6. Multivitamin daily. 7. Aspirin 81 mg a day. 8. Protonix 40 mg a day. 9. Albuterol inhaler p.r.n. 10.Coenzyme Q10 daily. 11.Vitamin B6 daily. 12.Vitamin D 2000 mg a day.     Theodore Demark, PA-C   ______________________________ Marca Ancona, MD    RB/MEDQ  D:  12/13/2010  T:  12/13/2010  Job:  [id number]  Electronically Signed by Theodore Demark PA-C on 12/23/2010 06:17:51 AM Electronically Signed by Marca Ancona MD on 12/29/2010 08:10:03 PM

## 2010-12-31 NOTE — H&P (Signed)
NAME:  Tammy [name], BORUFF NO.:  [id number]  MEDICAL RECORD NO.:  [id number]  LOCATION:  3731                         FACILITY:  [facility]  PHYSICIAN:  Duke Salvia, MD, FACCDATE OF BIRTH:  1955/12/14  DATE OF ADMISSION:  12/12/2010 DATE OF DISCHARGE:                             HISTORY & PHYSICAL   Please note, that the patient has 2 names and 2 medical record numbers. The patient is also listed in medical record under Tammy [name], medical record number [id number].  CHIEF COMPLAINT:  The patient is admitted tonight because of chest pain.  HISTORY OF PRESENT ILLNESS:  She is a 55 year old woman with a history of recurrent chest pain, known GE reflux disease.  She was admitted in 2009 for symptoms that are qualitatively similar to her description tonight that is epigastric and chest discomfort with radiation to the left jaw and left arm accompanied by nausea and having tried antacids without relief.  Because of protracted symptoms, she presented to the emergency room.  She underwent a Myoview scan demonstrating?  Breast attenuation versus anterior ischemia.  She underwent catheterization, and this demonstrated no ischemic heart disease.  She was submitted for risk factor modification for her hypertension and dyslipidemia.  She has nonexertional chest discomfort most of it is postprandial.  She has a known history of reflux which is associated with different symptoms.  She is anemic, related to 2009 on laboratory evaluation (see below).  She has dyspnea on exertion which has been progressive over the last couple of years.  She does not have nocturnal dyspnea, orthopnea, or peripheral edema.  She does sleep on two pillows which she attributes to her reflux and has been doing so for many years.  PAST MEDICAL HISTORY:  Her past medical history in addition to above is notable for: 1. Interstitial cystitis. 2. Tachy palpitations which are abrupt in onset and offset     interspersed with sinus bradycardia. 3. Irritable bowel disease. 4. Sleep apnea for which she is trying to pursue weight loss therapy     as opposed to CPAP.  PAST SURGICAL HISTORY:  Notable for bladder surgery type 2, ganglion cyst surgery, and hysterectomy related to cervical dysplasia.  SOCIAL HISTORY:  She is married.  She and her husband share three children.  She does not use cigarettes or recreational drugs.  She does use alcohol occasionally.  She has not exercised recently.  They are under a great deal of stress as her husband is anticipated to undergo pancreatic cyst surgery on Tuesday at [facility].  MEDICATIONS:  Include Lopressor, Toprol, lovastatin, Protonix, lisinopril 5, Ambien, and aspirin.  She is not aware of her doses.  At discharge in 2009, she was also taking hydrochlorothiazide.  She has been intolerant to a TETRACYCLINE DRUG in terms of GI intolerance.  REVIEW OF SYSTEMS:  As noted above.  PHYSICAL EXAMINATION:  GENERAL:  On examination, she is a middle-aged Caucasian female appearing her stated age of 55. VITAL SIGNS:  Her blood pressure is 159/87, her pulse was 45-50 and regular, her respirations were 18 and unlabored. HEENT:  Normal. NECK:  Veins were 9 to 10 cm.  Her carotids are brisk and full without  bruits. BACK:  Without kyphosis or scoliosis. LUNGS:  Clear. HEART:  Sounds were regular without gallops.  There was a 2/6 systolic murmur along the right sternal border. ABDOMEN:  Protuberant but soft.  There is no hepatojugular reflux. EXTREMITIES:  The femoral pulses were 2+, distal pulses were intact.  No clubbing, cyanosis, or edema. NEUROLOGICAL: Grossly normal. SKIN:  Warm and dry. PSYCHIATRIC:  Affect was engaging.  LABORATORIES:  Notable for hemoglobin of 11.3 and hematocrit of 33 in 2009 and the last one was 39.  The metabolic profile was normal. Cardiac enzymes are normal.  Had [facility] electrocardiogram dated today demonstrates  sinus rhythm at 58 with intervals of 0.16/0.08/0.42. There is ST-segment flattening in the inferolateral leads.  No old electrocardiograms are available for review.  IMPRESSION: 1. Chest pain with typical and atypical features. 2. Gastroesophageal reflux disease. 3. Cardiac evaluation in 2009 for similar symptoms with a Myoview that     demonstrated ischemia versus breast attenuation and subsequent     catheterization that was normal. 4. Resting bradycardia. 5. Baseline abnormal ECG abnormalities which would preclude standard     stress testing. 6. Cardiac risk factors notable for hypertension, dyslipidemia, and     family history.  DISCUSSION:  [name] is undergoing a great deal of stress and has symptoms similar to her presentation in 2009.  I suspect that this is noncardiac.  This also occurs in the context of anemia which will require further evaluation.  I suspect that this is GI in origin.  The challenge here is the false positive Myoview.  Given the description of it, however, there may be a way to repeat it and anticipate breast attenuation and look for changes.  This would be the most expeditiousthing as her husband is scheduled to undergo surgery at [facility] on Tuesday.  In the event that this is concerning, she would need to undergo catheterization.  She takes PPI for her for her GE reflux disease; evaluating her GI tract for blood loss is impaired of as she is now anemic in the setting of being post hysterectomy.     Duke Salvia, MD, [facility]     SCK/MEDQ  D:  12/12/2010  T:  12/12/2010  Job:  [id number]  Electronically Signed by Sherryl Manges MD FACC on 12/31/2010 07:28:09 AM

## 2011-04-29 ENCOUNTER — Other Ambulatory Visit: Payer: Self-pay | Admitting: Cardiology

## 2011-05-02 ENCOUNTER — Other Ambulatory Visit: Payer: Self-pay | Admitting: *Deleted

## 2011-06-23 ENCOUNTER — Telehealth: Payer: Self-pay | Admitting: Internal Medicine

## 2011-06-24 NOTE — Telephone Encounter (Signed)
Spoke with pt and she is aware. States she will check with her husbands schedule and call us back to schedule. Pt is very nervous about having to drink the prep.

## 2011-06-24 NOTE — Telephone Encounter (Signed)
NO PILL PREP LIKE THAT AVAILABLE. I RECOMMEND MOVI PREP. IT IS SPLIT WITH LOWER VOLUME AT ONE TIME. ALSO, KEEP HARD CANDY IN MOUTH WHILE DRINKING PREP (HELPS W/ TASTE). FINALLY, CAN PRESCRIBE REGLAN 10 MG 20 MIN PRIOR TO EACH SESSION TO HELP W/ NAUSEA.

## 2011-06-24 NOTE — Telephone Encounter (Signed)
Pt states that she was due for a colon in August but her husband has been quite sick and she has not been able to schedule the colon. States she has a hard time drinking any liquid medication, afraid she would not be able to do the prep. Pt read something about only taking 4 pills for a prep, 2 the night before and 2 the morning of the procedure. She wants to know if that is something that she could possibly use. Dr. Marina Goodell please advise.

## 2011-09-28 ENCOUNTER — Other Ambulatory Visit: Payer: Self-pay

## 2011-09-28 ENCOUNTER — Other Ambulatory Visit: Payer: Self-pay | Admitting: Cardiology

## 2011-09-28 NOTE — Telephone Encounter (Signed)
Refilled lisinopril

## 2011-10-04 MED ORDER — LISINOPRIL-HYDROCHLOROTHIAZIDE 20-12.5 MG PO TABS: 1.0000 | ORAL_TABLET | Freq: Every day | ORAL | Status: DC

## 2011-11-10 ENCOUNTER — Encounter: Payer: Self-pay | Admitting: Family Medicine

## 2011-11-11 ENCOUNTER — Encounter: Payer: Self-pay | Admitting: *Deleted

## 2011-11-14 ENCOUNTER — Ambulatory Visit (INDEPENDENT_AMBULATORY_CARE_PROVIDER_SITE_OTHER): Payer: BC Managed Care – PPO | Admitting: Family Medicine

## 2011-11-14 ENCOUNTER — Ambulatory Visit: Payer: BC Managed Care – PPO

## 2011-11-14 ENCOUNTER — Encounter: Payer: Self-pay | Admitting: Family Medicine

## 2011-11-14 VITALS — BP 150/93 | HR 60 | Temp 97.8°F | Resp 16 | Ht 59.0 in | Wt 157.0 lb

## 2011-11-14 DIAGNOSIS — R05 Cough: Secondary | ICD-10-CM

## 2011-11-14 DIAGNOSIS — N879 Dysplasia of cervix uteri, unspecified: Secondary | ICD-10-CM

## 2011-11-14 DIAGNOSIS — Z0000 Encounter for general adult medical examination without abnormal findings: Secondary | ICD-10-CM

## 2011-11-14 DIAGNOSIS — Z01419 Encounter for gynecological examination (general) (routine) without abnormal findings: Secondary | ICD-10-CM

## 2011-11-14 DIAGNOSIS — Z23 Encounter for immunization: Secondary | ICD-10-CM

## 2011-11-14 DIAGNOSIS — Z1239 Encounter for other screening for malignant neoplasm of breast: Secondary | ICD-10-CM

## 2011-11-14 DIAGNOSIS — R1319 Other dysphagia: Secondary | ICD-10-CM

## 2011-11-14 DIAGNOSIS — K635 Polyp of colon: Secondary | ICD-10-CM

## 2011-11-14 LAB — POCT URINALYSIS DIPSTICK
Bilirubin, UA: NEGATIVE
Glucose, UA: NEGATIVE
Leukocytes, UA: NEGATIVE
Nitrite, UA: NEGATIVE
Urobilinogen, UA: 0.2

## 2011-11-14 LAB — POCT UA - MICROSCOPIC ONLY: Crystals, Ur, HPF, POC: NEGATIVE

## 2011-11-14 MED ORDER — PNEUMOCOCCAL VAC POLYVALENT 25 MCG/0.5ML IJ INJ: 0.5000 mL | INJECTION | INTRAMUSCULAR | Status: AC

## 2011-11-14 NOTE — Patient Instructions (Addendum)
1. Breast cancer screening  MM Digital Screening  2. Routine general medical examination at a health care facility  POCT urinalysis dipstick, CBC, Comprehensive metabolic panel, Lipid panel, TSH, Vitamin B12, Vitamin D 25 hydroxy, EKG 12-Lead, IFOBT POC (occult bld, rslt in office)  3. Routine gynecological examination    4. Cervical dysphagia  Pap IG (Image Guided)  5. Cough  DG Chest 2 View, Ambulatory referral to Pulmonology  6. Flu vaccine need  Flu vaccine greater than or equal to 57yo preservative free IM

## 2011-11-14 NOTE — Progress Notes (Signed)
Subjective:    Patient ID: Tammy [name], female    DOB: 04/08/1955, 56 y.o.   MRN: [id number]  HPIThis 56 y.o. female presents for evaluation of CPE.  Last physical 09/2010.   Pap smear 2012.   Mammogram 2012.  Colonoscopy 2006; one year overdue for colonoscopy; plans to schedule..   TDAP unknown.  Pneumovax never.    Influenza yearly.   Zostavax never.  Eye exam 2012; +glasses; no g/c.  Dental exam 11/2010.    Review of Systems  Constitutional: Negative for fever, chills, diaphoresis, activity change, appetite change, fatigue and unexpected weight change.  HENT: Negative for hearing loss, ear pain, nosebleeds, congestion, sore throat, facial swelling, rhinorrhea, sneezing, drooling, mouth sores, trouble swallowing, neck pain, neck stiffness, dental problem, voice change, postnasal drip, sinus pressure, tinnitus and ear discharge.   Eyes: Negative for photophobia, pain, discharge, redness, itching and visual disturbance.  Respiratory: Positive for cough. Negative for apnea, choking, chest tightness, shortness of breath, wheezing and stridor.   Cardiovascular: Negative for chest pain, palpitations and leg swelling.  Gastrointestinal: Negative for nausea, vomiting, abdominal pain, diarrhea, constipation, blood in stool, abdominal distention, anal bleeding and rectal pain.  Genitourinary: Negative for dysuria, urgency, frequency, hematuria, flank pain, vaginal bleeding, vaginal discharge, enuresis, difficulty urinating, genital sores, vaginal pain, pelvic pain and dyspareunia.  Musculoskeletal: Negative for myalgias, back pain, joint swelling, arthralgias and gait problem.  Skin: Negative for color change, pallor, rash and wound.  Neurological: Negative for dizziness, tremors, seizures, syncope, facial asymmetry, speech difficulty, weakness, light-headedness, numbness and headaches.  Hematological: Negative for adenopathy. Does not bruise/bleed easily.  Psychiatric/Behavioral: Negative for  suicidal ideas, hallucinations, behavioral problems, confusion, sleep disturbance, self-injury, dysphoric mood, decreased concentration and agitation. The patient is not nervous/anxious and is not hyperactive.     Past Medical History  Diagnosis Date  . Gastric ulcer, unspecified as acute or chronic, without mention of hemorrhage, perforation, or obstruction   . Dysplasia of cervix, unspecified   . Personal history of colonic polyps   . Irritable bowel syndrome   . Essential hypertension, benign   . Carpal tunnel syndrome   . Insomnia, unspecified   . Esophageal reflux   . Diaphragmatic hernia without mention of obstruction or gangrene   . Pure hypercholesterolemia   . Allergic rhinitis, cause unspecified   . Mixed incontinence urge and stress (female)(female)   . Chronic interstitial cystitis   . Methicillin resistant Staphylococcus aureus in conditions classified elsewhere and of unspecified site   . Hiatal hernia   . Colon polyps 03/07/2004    Past Surgical History  Procedure Date  . Tonsillectomy and adenoidectomy   . Carpal tunnel release   . Abdominal hysterectomy 03/07/2000    CERVICAL DYSPLASIA; OVARIES INTACT  . Colonoscopy 03/07/2004    colon polyps single.  [place].    Prior to Admission medications   Medication Sig Start Date End Date Taking? Authorizing Provider  albuterol (PROVENTIL HFA;VENTOLIN HFA) 108 (90 BASE) MCG/ACT inhaler Inhale 2 puffs into the lungs every 6 (six) hours as needed.   Yes Historical Provider, MD  albuterol (PROVENTIL) (2.5 MG/3ML) 0.083% nebulizer solution Take 2.5 mg by nebulization every 6 (six) hours as needed.   Yes Historical Provider, MD  aspirin 81 MG tablet Take 81 mg by mouth daily.   Yes Historical Provider, MD  fish oil-omega-3 fatty acids 1000 MG capsule Take 2 g by mouth daily.   Yes Historical Provider, MD  lovastatin (MEVACOR) 40 MG tablet TAKE 1 TABLET  BY MOUTH DAILY 04/29/11  Yes Peter M [place], MD  Multiple Vitamin (MULTIVITAMIN)  tablet Take 1 tablet by mouth daily.   Yes Historical Provider, MD  OXYGEN-HELIUM IN Inhale 2 L/min into the lungs at bedtime.   Yes Historical Provider, MD  pantoprazole (PROTONIX) 40 MG tablet Take 40 mg by mouth 2 (two) times daily.   Yes Historical Provider, MD  zolpidem (AMBIEN) 10 MG tablet Take 10 mg by mouth at bedtime as needed.   Yes Historical Provider, MD  acetaminophen-codeine (TYLENOL #3) 300-30 MG per tablet Take 1 tablet by mouth every 6 (six) hours as needed.    Historical Provider, MD  budesonide-formoterol (SYMBICORT) 80-4.5 MCG/ACT inhaler Inhale 2 puffs into the lungs 2 (two) times daily.    Historical Provider, MD  mometasone (NASONEX) 50 MCG/ACT nasal spray Place 2 sprays into the nose daily.    Historical Provider, MD  olmesartan (BENICAR) 20 MG tablet Take 10 mg by mouth daily.    Historical Provider, MD  traMADol (ULTRAM) 50 MG tablet Take 1 tablet (50 mg total) by mouth every 6 (six) hours as needed for pain (cough). 11/21/11 11/20/12  Waymon Budge, MD    Allergies  Allergen Reactions  . Septra Ds (Sulfamethoxazole-Tmp Ds) Nausea Only    History   Social History  . Marital Status: Married    Spouse Name: N/A    Number of Children: 2  . Years of Education: N/A   Occupational History  . 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 Smoking status: Never Smoker   . Smokeless tobacco: Not on file  . Alcohol Use: Yes     Comment: occasional once per month on average. Twisted Tea.  . Drug Use: Not on file  . Sexually Active: Not on file   Other Topics Concern  . Not on file   Social History Narrative   Exercise: 3-5 days per week walking with husband. Married x 36 years; happy; no abuse. Husband on long term disability. Pt's daughter and grandchild also live in the home.  2 children,1 stepchild and 5 grandchildren.  No tobacco history; +drinks twice monthly; no drugs.  Exercise:   sporadically    Family History  Problem Relation Age of Onset  . Hyperlipidemia Mother   . Hypertension Mother   . Hypertension Father   . Coronary artery disease Father     stents  . Heart disease Father 103    stenting  . Hyperlipidemia Father        Objective:   Physical Exam  Nursing note and vitals reviewed. Constitutional: She is oriented to person, place, and time. She appears well-developed and well-nourished. No distress.  HENT:  Head: Normocephalic and atraumatic.  Right Ear: External ear normal.  Left Ear: External ear normal.  Nose: Nose normal.  Mouth/Throat: Oropharynx is clear and moist.  Eyes: Conjunctivae normal and EOM are normal. Pupils are equal, round, and reactive to light.  Neck: Normal range of motion. Neck supple. No JVD present. No thyromegaly present.  Cardiovascular: Normal rate, regular rhythm, normal heart sounds and intact distal pulses.  Exam reveals no gallop and no friction rub.   No murmur heard. Pulmonary/Chest: Effort normal and breath sounds normal. She has no wheezes. She has no rales. She exhibits no tenderness.  Abdominal: Soft. Bowel sounds are normal. She exhibits no distension and no mass. There is no tenderness. There is no rebound  and no guarding. Hernia confirmed negative in the right inguinal area and confirmed negative in the left inguinal area.  Genitourinary: Vagina normal. No breast swelling, tenderness, discharge or bleeding. There is no rash, tenderness or lesion on the right labia. There is no rash, tenderness or lesion on the left labia. Right adnexum displays no mass, no tenderness and no fullness. Left adnexum displays no mass, no tenderness and no fullness. No vaginal discharge found.  Musculoskeletal: Normal range of motion.  Lymphadenopathy:    She has no cervical adenopathy.       Right: No inguinal adenopathy present.       Left: No inguinal adenopathy present.  Neurological: She is alert and oriented to person,  place, and time. She has normal reflexes. No cranial nerve deficit. She exhibits normal muscle tone. Coordination normal.  Skin: Skin is warm and dry. No rash noted. She is not diaphoretic. No erythema. No pallor.  Psychiatric: She has a normal mood and affect. Her behavior is normal. Judgment and thought content normal.   INFLUENZA VACCINE AND PNEUMOCOCCAL VACCINE ADMINISTERED.       Assessment & Plan:   1. Breast cancer screening  MM Digital Screening  2. Routine general medical examination at a health care facility  POCT urinalysis dipstick, CBC, Comprehensive metabolic panel, Lipid panel, TSH, Vitamin B12, Vitamin D 25 hydroxy, EKG 12-Lead, IFOBT POC (occult bld, rslt in office), POCT UA - Microscopic Only  3. Routine gynecological examination    4. Cervical dysphagia  Pap IG (Image Guided)  5. Cough  DG Chest 2 View, Ambulatory referral to Pulmonology  6. Flu vaccine need  Flu vaccine greater than or equal to 3yo preservative free IM  7. Need for prophylactic vaccination against Streptococcus pneumoniae (pneumococcus)  pneumococcal 23 valent vaccine (PNU-IMMUNE) injection 0.5 mL   Meds ordered this encounter  Medications  . pneumococcal 23 valent vaccine (PNU-IMMUNE) injection 0.5 mL    Sig:

## 2011-11-15 LAB — CBC
Hemoglobin: 12.1 g/dL (ref 12.0–15.0)
Platelets: 281 10*3/uL (ref 150–400)
RBC: 4.26 MIL/uL (ref 3.87–5.11)
WBC: 4.7 10*3/uL (ref 4.0–10.5)

## 2011-11-15 LAB — COMPREHENSIVE METABOLIC PANEL
ALT: 13 U/L (ref 0–35)
Albumin: 4.2 g/dL (ref 3.5–5.2)
BUN: 20 mg/dL (ref 6–23)
CO2: 27 mEq/L (ref 19–32)
Calcium: 9.4 mg/dL (ref 8.4–10.5)
Chloride: 105 mEq/L (ref 96–112)
Creat: 0.66 mg/dL (ref 0.50–1.10)
Potassium: 3.8 mEq/L (ref 3.5–5.3)

## 2011-11-15 LAB — PAP IG (IMAGE GUIDED)

## 2011-11-15 LAB — LIPID PANEL
Cholesterol: 171 mg/dL (ref 0–200)
HDL: 42 mg/dL (ref >39)
Total CHOL/HDL Ratio: 4.1 Ratio

## 2011-11-15 LAB — VITAMIN B12: Vitamin B-12: 336 pg/mL (ref 211–911)

## 2011-11-17 ENCOUNTER — Institutional Professional Consult (permissible substitution): Payer: Self-pay | Admitting: Internal Medicine

## 2011-11-18 ENCOUNTER — Other Ambulatory Visit: Payer: Self-pay | Admitting: Family Medicine

## 2011-11-18 DIAGNOSIS — R911 Solitary pulmonary nodule: Secondary | ICD-10-CM

## 2011-11-21 ENCOUNTER — Telehealth: Payer: Self-pay | Admitting: Family Medicine

## 2011-11-21 ENCOUNTER — Encounter: Payer: Self-pay | Admitting: Internal Medicine

## 2011-11-21 ENCOUNTER — Ambulatory Visit (INDEPENDENT_AMBULATORY_CARE_PROVIDER_SITE_OTHER): Payer: BC Managed Care – PPO | Admitting: Internal Medicine

## 2011-11-21 VITALS — BP 118/62 | HR 81 | Ht 60.0 in | Wt 156.0 lb

## 2011-11-21 DIAGNOSIS — R911 Solitary pulmonary nodule: Secondary | ICD-10-CM

## 2011-11-21 DIAGNOSIS — R05 Cough: Secondary | ICD-10-CM

## 2011-11-21 DIAGNOSIS — G4733 Obstructive sleep apnea (adult) (pediatric): Secondary | ICD-10-CM

## 2011-11-21 MED ORDER — TRAMADOL HCL 50 MG PO TABS: 50.0000 mg | ORAL_TABLET | Freq: Four times a day (QID) | ORAL | Status: AC | PRN

## 2011-11-21 NOTE — Telephone Encounter (Signed)
Below noted.  Dr. Roxy Cedar office to order CT scan chest and follow-up. KMS

## 2011-11-21 NOTE — Patient Instructions (Addendum)
Stop the lisinopril, which is an ACE inhibitor and may be causing your dry cough. Use the Benicar 20 samples, 1/2 or 1 daily for 3-4 weeks, following your BP at home. It can take that long to see an effect of the change.  Order- schedule CT chest, no contrast  Dx lung nodule  Script to try tramadol as a cough suppressant  Order- schedule NPSG split protocol    Dx OSA

## 2011-11-21 NOTE — Progress Notes (Signed)
11/21/11- 62 yoF never smoker referred courtesy of Dr.Kristi Smith/ [facility] with complaint of coughing. Cough x 2-3 years; "smokers cough" in morning-NEVER smoked; Dr Nilda Simmer.   Husband here Wife of our patient Tammy [name]. Has already had flu and pneumonia vaccines.  Describes 2 years of chronic cough with chest tightness but she does not hear wheezing. Albuterol inhaler and nebulizer had helped in the past. She was started on 2 L of oxygen at night based on overnight oximetry. Cough is usually dry. . Reports that she snores, father snores, and she is bothered by daytime sleepiness. Husband raises question of sleep apnea. Denies history of seasonal rhinitis. Describes "dry sinus headache" which she treats with saline spray. Does not wake up choking or strangled but does have a history of GERD with hiatal hernia and history of peptic ulcer disease. Does not notice choking or coughing with meals. Takes lisinopril/ACE inhibitor and we discussed potential for cough without drug class. 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 Denies family history of respiratory disease. Chest x-ray by Dr. Katrinka Blazing had question posterior lung nodule versus osteophyte. CXR 11/20/11- reviewed IMPRESSION:  1 x 2 cm oval opacity overlies the upper thoracic spine on the  lateral projection. This may be related to spinal osteophytes, but  a posterior parenchymal nodule cannot be excluded. CT chest  without contrast recommended to further evaluate.  Clinically significant discrepancy from primary report, if  provided: Nodular density overlying the thoracic spine on the  lateral projection.  Original Report Authenticated By: ERIC A. MANSELL, M.D.   Prior to Admission medications   Medication Sig Start Date End Date Taking? Authorizing Provider  acetaminophen-codeine (TYLENOL #3) 300-30 MG per tablet Take 1 tablet by mouth every 6 (six) hours as needed.   Yes Historical Provider, MD    albuterol (PROVENTIL HFA;VENTOLIN HFA) 108 (90 BASE) MCG/ACT inhaler Inhale 2 puffs into the lungs every 6 (six) hours as needed.   Yes Historical Provider, MD  albuterol (PROVENTIL) (2.5 MG/3ML) 0.083% nebulizer solution Take 2.5 mg by nebulization every 6 (six) hours as needed.   Yes Historical Provider, MD  aspirin 81 MG tablet Take 81 mg by mouth daily.   Yes Historical Provider, MD  fish oil-omega-3 fatty acids 1000 MG capsule Take 2 g by mouth daily.   Yes Historical Provider, MD  lisinopril-hydrochlorothiazide (PRINZIDE,ZESTORETIC) 20-12.5 MG per tablet Take 1 tablet by mouth daily. 09/28/11  Yes Peter M [place], MD  lovastatin (MEVACOR) 40 MG tablet TAKE 1 TABLET BY MOUTH DAILY 04/29/11  Yes Peter M [place], MD  mometasone (NASONEX) 50 MCG/ACT nasal spray Place 2 sprays into the nose daily.   Yes Historical Provider, MD  Multiple Vitamin (MULTIVITAMIN) tablet Take 1 tablet by mouth daily.   Yes Historical Provider, MD  OXYGEN-HELIUM IN Inhale 2 L/min into the lungs at bedtime.   Yes Historical Provider, MD  pantoprazole (PROTONIX) 40 MG tablet Take 40 mg by mouth 2 (two) times daily.   Yes Historical Provider, MD  zolpidem (AMBIEN) 10 MG tablet Take 10 mg by mouth at bedtime as needed.   Yes Historical Provider, MD  budesonide-formoterol (SYMBICORT) 80-4.5 MCG/ACT inhaler Inhale 2 puffs into the lungs 2 (two) times daily.    Historical Provider, MD  traMADol (ULTRAM) 50 MG tablet Take 1 tablet (50 mg total) by mouth every 6 (six) hours as needed for pain (cough). 11/21/11 11/20/12  Waymon Budge, MD   Past Medical History  Diagnosis Date  .  Gastric ulcer, unspecified as acute or chronic, without mention of hemorrhage, perforation, or obstruction   . Dysplasia of cervix, unspecified   . Personal history of colonic polyps   . Irritable bowel syndrome   . Essential hypertension, benign   . Carpal tunnel syndrome   . Insomnia, unspecified   . Esophageal reflux   . Diaphragmatic hernia  without mention of obstruction or gangrene   . Pure hypercholesterolemia   . Allergic rhinitis, cause unspecified   . Mixed incontinence urge and stress (female)(female)   . Chronic interstitial cystitis   . Methicillin resistant Staphylococcus aureus in conditions classified elsewhere and of unspecified site   . Hiatal hernia   . Colon polyps 03/07/2004   Past Surgical History  Procedure Date  . Tonsillectomy and adenoidectomy   . Carpal tunnel release   . Abdominal hysterectomy 03/07/2000    CERVICAL DYSPLASIA; OVARIES INTACT  . Colonoscopy 03/07/2004    colon polyps single.  [place].   Family History  Problem Relation Age of Onset  . Hyperlipidemia Mother   . Hypertension Mother   . Hypertension Father   . Coronary artery disease Father     stents  . Heart disease Father 60    stenting  . Hyperlipidemia Father    History   Social History  . Marital Status: Married    Spouse Name: N/A    Number of Children: 2  . Years of Education: N/A   Occupational History  . 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 Smoking status: Never Smoker   . Smokeless tobacco: Not on file  . Alcohol Use: Yes     occasional once per month on average. Twisted Tea.  . Drug Use: Not on file  . Sexually Active: Not on file   Other Topics Concern  . Not on file   Social History Narrative   Exercise: 3-5 days per week walking with husband. Married x 36 years; happy; no abuse. Husband on long term disability. Pt's daughter and grandchild also live in the home.  2 children,1 stepchild and 5 grandchildren.  No tobacco history; +drinks twice monthly; no drugs.  Exercise:  sporadically   ROS-see HPI Constitutional:   No-   weight loss, night sweats, fevers, chills, fatigue, lassitude. HEENT:   +  headaches, no-difficulty swallowing, tooth/dental problems, sore throat,       No-  sneezing, itching, ear ache, nasal congestion, post  nasal drip,  CV:  No-   chest pain, orthopnea, PND, swelling in lower extremities, anasarca,  dizziness, +palpitations Resp: No-   shortness of breath with exertion or at rest.            +  productive cough,  + non-productive cough,  No- coughing up of blood.              No-   change in color of mucus.  No- wheezing.   Skin: No-   rash or lesions. GI:  + heartburn, indigestion, no-abdominal pain, nausea, vomiting, diarrhea,                 change in bowel habits, loss of appetite GU: No-   dysuria, change in color of urine, no urgency or frequency.  No- flank pain. MS:  No-   joint pain or swelling.  No- decreased range of motion.  No- back pain. Neuro-     nothing unusual Psych:  No-  change in mood or affect. No depression or anxiety.  No memory loss.  OBJ- Physical Exam General- Alert, Oriented, Affect-appropriate, Distress- none acute Skin- rash-none, lesions- none, excoriation- none Lymphadenopathy- none Head- atraumatic            Eyes- Gross vision intact, PERRLA, conjunctivae and secretions clear            Ears- Hearing, canals-normal            Nose- Clear, no-Septal dev, mucus, polyps, erosion, perforation             Throat- Mallampati II , mucosa clear , drainage- none, tonsils- atrophic Neck- flexible , trachea midline, no stridor , thyroid nl, carotid no bruit Chest - symmetrical excursion , unlabored           Heart/CV- RRR , no murmur , no gallop  , no rub, nl s1 s2                           - JVD- none , edema- none, stasis changes- none, varices- none           Lung- clear to P&A, wheeze- none, + dry cough with deep breath , dullness-none, rub- none           Chest wall-  Abd- tender-no, distended-no, bowel sounds-present, HSM- no Br/ Gen/ Rectal- Not done, not indicated Extrem- cyanosis- none, clubbing, none, atrophy- none, strength- nl Neuro- grossly intact to observation

## 2011-11-21 NOTE — Telephone Encounter (Signed)
Katie from Dr. Roxy Cedar office called, stated that Dr. Maple Hudson has placed the order for the CT scan and he is going to assume responsibility for the appointments, And follow-ups. I did see that there is a CT pending so I have let Referrals know.

## 2011-11-22 ENCOUNTER — Telehealth: Payer: Self-pay

## 2011-11-22 ENCOUNTER — Encounter: Payer: Self-pay | Admitting: Family Medicine

## 2011-11-22 ENCOUNTER — Ambulatory Visit (INDEPENDENT_AMBULATORY_CARE_PROVIDER_SITE_OTHER)
Admission: RE | Admit: 2011-11-22 | Discharge: 2011-11-22 | Disposition: A | Discharge status: Home / Self Care | Payer: BC Managed Care – PPO | Source: Ambulatory Visit | Attending: Internal Medicine | Admitting: Internal Medicine

## 2011-11-22 DIAGNOSIS — R911 Solitary pulmonary nodule: Secondary | ICD-10-CM

## 2011-11-22 NOTE — Telephone Encounter (Signed)
Pt called to let Dr. Katrinka Blazing know she has had the CT scan done and the results are available through epic if Dr. Katrinka Blazing would like to view them.

## 2011-11-22 NOTE — Telephone Encounter (Signed)
Pt wanted to let Dr Katrinka Blazing know that she had her CT scan ordered by Dr. Maple Hudson and that the results are available through epic.  Best [phone]

## 2011-11-23 ENCOUNTER — Telehealth: Payer: Self-pay

## 2011-11-23 ENCOUNTER — Telehealth: Payer: Self-pay | Admitting: Internal Medicine

## 2011-11-23 ENCOUNTER — Encounter: Payer: Self-pay | Admitting: Family Medicine

## 2011-11-23 NOTE — Telephone Encounter (Signed)
These nodules are too small to cause symptoms and are just incidental. I will review at office f/u when we have set aside time for questions and explanations.

## 2011-11-23 NOTE — Telephone Encounter (Signed)
PT STATES SHE HAD A CT SCAN DONE AT [facility] AND THEY ARE NOT ON EPIC BUT ANOTHER SYSTEM SO SHE DIDN'T KNOW HOW WE WERE GOING TO HAVE ACCESS TO HER REPORT WOULD LIKE A CALL BACK AT [phone]

## 2011-11-23 NOTE — Telephone Encounter (Signed)
Notes Recorded by Christen Butter, CMA on 11/23/2011 at 4:22 PM Pt aware. See phone note dated 11/23/11 ------  Notes Recorded by Waymon Budge, MD on 11/23/2011 at 8:21 AM CT chest- I have reviewed the images. There are 3 very small nodules. At least one may just be a lymph node and they may just be scars. . They are too small to do anything with except follow for now. We will discuss at next ov.   Pt aware that CY back in office on Thursday morning and will have him address this then. Pt verbalized understanding of this and will await a call back Thursday.

## 2011-11-23 NOTE — Telephone Encounter (Signed)
Below message noted; please call and thank patient for update.  I will review results. Has Dr. Maple Hudson advised her of results yet?  KMS

## 2011-11-23 NOTE — Telephone Encounter (Signed)
Spoke w/pt and gave her Dr [facility] [employer] message. Pt stated that Dr Roxy Cedar office had not talked w/her yet but she saw the radiologist's report. Pt will contact Dr Roxy Cedar office tomorrow, but she would still really like Dr Michaelle Copas opinion/recommendations for f/up on the CT because Dr Katrinka Blazing has known her for so long and she trusts her opinion. She is concerned about waiting 6 mos to repeat test due to the length of time she has had Sxs and she doesn't want to leave anything that might be cancerous to grow for that long. Pt states that Radiologist is supposed to be doing an addendum to report after he receives the older CXR to compare, so Dr Katrinka Blazing might want to wait to review that first. Please advise.

## 2011-11-23 NOTE — Progress Notes (Signed)
Quick Note:  Pt aware. See phone note dated 11/23/11 ______

## 2011-11-23 NOTE — Telephone Encounter (Signed)
Spoke with pt. She had CT Chest done 11/21/11 and went on my chart to view results. She read report and is now concerned about nodules. I advised her of the result note per CDY, and she verbalized understanding of his recs, but wishes to discuss with him sooner than her next ov on 01/02/12. She is wanting to know if CDY thinks that the nodules are what was causing her to cough and have respiratory illnesses now and in the past. Please call the pt, thanks!

## 2011-11-24 NOTE — Telephone Encounter (Signed)
I spoke with pt and she voiced her understanding. She did not need anything further and had no further questions

## 2011-11-27 DIAGNOSIS — G4733 Obstructive sleep apnea (adult) (pediatric): Secondary | ICD-10-CM | POA: Insufficient documentation

## 2011-11-27 DIAGNOSIS — R911 Solitary pulmonary nodule: Secondary | ICD-10-CM

## 2011-11-27 HISTORY — DX: Solitary pulmonary nodule: R91.1

## 2011-11-27 NOTE — Assessment & Plan Note (Addendum)
Plan-CT chest

## 2011-11-27 NOTE — Assessment & Plan Note (Signed)
She raises concern of possible sleep apnea based on reported snoring pattern and daytime tiredness. Her husband has noted some snoring but is not otherwise attentive. Her body habitus would be consistent with obstructive sleep apnea. We work with her husband's sleep concerns. Plan-schedule sleep study. Discussed basic sleep hygiene.

## 2011-12-05 ENCOUNTER — Ambulatory Visit: Payer: Self-pay

## 2011-12-06 ENCOUNTER — Encounter: Payer: Self-pay | Admitting: Internal Medicine

## 2011-12-27 ENCOUNTER — Ambulatory Visit (HOSPITAL_BASED_OUTPATIENT_CLINIC_OR_DEPARTMENT_OTHER): Payer: BC Managed Care – PPO

## 2011-12-28 ENCOUNTER — Ambulatory Visit
Admission: RE | Admit: 2011-12-28 | Discharge: 2011-12-28 | Disposition: A | Discharge status: Home / Self Care | Payer: BC Managed Care – PPO | Source: Ambulatory Visit | Attending: Family Medicine | Admitting: Family Medicine

## 2011-12-28 DIAGNOSIS — Z1239 Encounter for other screening for malignant neoplasm of breast: Secondary | ICD-10-CM

## 2012-01-02 ENCOUNTER — Encounter: Payer: Self-pay | Admitting: Internal Medicine

## 2012-01-02 ENCOUNTER — Ambulatory Visit (INDEPENDENT_AMBULATORY_CARE_PROVIDER_SITE_OTHER): Payer: BC Managed Care – PPO | Admitting: Internal Medicine

## 2012-01-02 VITALS — BP 132/80 | HR 53 | Ht 60.0 in | Wt 156.6 lb

## 2012-01-02 DIAGNOSIS — R911 Solitary pulmonary nodule: Secondary | ICD-10-CM

## 2012-01-02 DIAGNOSIS — G4733 Obstructive sleep apnea (adult) (pediatric): Secondary | ICD-10-CM

## 2012-01-02 NOTE — Patient Instructions (Addendum)
Order-  CT chest, no contrast, future, dx lung nodule,    To be done before next ov in 6 months   Ok to reschedule the sleep study when you are able  Please talk with your primary doctor or cardiologist about changing your BP pill from lisinopril to something that is not an ACE inhibitor, since lisinopril seems to have caused cough.

## 2012-01-02 NOTE — Progress Notes (Signed)
11/21/11- 53 yoF never smoker referred courtesy of Dr.Kristi Smith/ [facility] with complaint of coughing. Cough x 2-3 years; "smokers cough" in morning-NEVER smoked; Dr Nilda Simmer.   Husband here Wife of our patient Tammy [name]. Has already had flu and pneumonia vaccines.  Describes 2 years of chronic cough with chest tightness but she does not hear wheezing. Albuterol inhaler and nebulizer had helped in the past. She was started on 2 L of oxygen at night based on overnight oximetry. Cough is usually dry. . Reports that she snores, father snores, and she is bothered by daytime sleepiness. Husband raises question of sleep apnea. Denies history of seasonal rhinitis. Describes "dry sinus headache" which she treats with saline spray. Does not wake up choking or strangled but does have a history of GERD with hiatal hernia and history of peptic ulcer disease. Does not notice choking or coughing with meals. Takes lisinopril/ACE inhibitor and we discussed potential for cough without drug class. 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 Denies family history of respiratory disease. Chest x-ray by Dr. Katrinka Blazing had question posterior lung nodule versus osteophyte. CXR 11/20/11- reviewed IMPRESSION:  1 x 2 cm oval opacity overlies the upper thoracic spine on the  lateral projection. This may be related to spinal osteophytes, but  a posterior parenchymal nodule cannot be excluded. CT chest  without contrast recommended to further evaluate.  Clinically significant discrepancy from primary report, if  provided: Nodular density overlying the thoracic spine on the  lateral projection.  Original Report Authenticated By: ERIC A. MANSELL, M.D.   01/02/12- 77 yoF never smoker followed for cough, ? Lung nodule   Husband here Denies any increased cough-feels like BP med is helping this now; unable  to have sleep study at this time due to personal/family issues; will call to [facility] once everything is  cleared up. Less coughing off lisinopril/changed to Benicar. Not needing tramadol for cough. CT- 11/21/11-reviewed with them IMPRESSION:  1. Three pulmonary nodules include a 5 mm right lower lobe nodule,  3 mm right lower lobe nodule, and 5 mm left lower lobe nodule.  Left lower lobe nodule is immediately along the major fissure,  providing increased testicle likelihood of being a subpleural lymph  node. If the patient is at high risk for bronchogenic carcinoma,  follow-up chest CT at 6-12 months is recommended. If the patient  is at low risk for bronchogenic carcinoma, follow-up chest CT at 12  months is recommended. This recommendation follows the consensus  statement: Guidelines for Management of Small Pulmonary Nodules  Detected on CT Scans: A Statement from the Fleischner Society as  published in Radiology 2005; 237:395-400.  2. If prior chest radiograph and / or report can be provided, we  are happy to review these and issue an addendum directly comparing  the findings.  Original Report Authenticated By: Dellia Cloud, M.D.  Addend- The density projecting over the anterior thoracic spine at the T6-7  level on the radiographs appears to correspond to a moderately  prominent thoracic spur as shown on images 56-58 of series 603.  This does not correspond to the small nodules shown on the recent  CT scan, which are not readily visible on chest radiography due to  their small size. Original recommendations regarding follow-up of  these nodules from the initial CT chest report remain in force.  **END ADDENDUM** SIGNED BY  ROS-see HPI Constitutional:   No-   weight loss, night sweats, fevers, chills, fatigue, lassitude.  HEENT:   +  headaches, no-difficulty swallowing, tooth/dental problems, sore throat,       No-  sneezing, itching, ear ache, nasal congestion, post nasal drip,  CV:  No-   chest pain, orthopnea, PND, swelling in lower extremities, anasarca,  dizziness,  +palpitations Resp: No-   shortness of breath with exertion or at rest.            No-  productive cough,  + less non-productive cough,  No- coughing up of blood.              No-   change in color of mucus.  No- wheezing.   Skin: No-   rash or lesions. GI:  + heartburn, indigestion, no-abdominal pain, nausea, vomiting,  GU: . MS:  No-   joint pain or swelling.   Neuro-     nothing unusual Psych:  No- change in mood or affect. No depression or anxiety.  No memory loss.  OBJ- Physical Exam General- Alert, Oriented, Affect-appropriate, Distress- none acute Skin- rash-none, lesions- none, excoriation- none Lymphadenopathy- none Head- atraumatic            Eyes- Gross vision intact, PERRLA, conjunctivae and secretions clear            Ears- Hearing, canals-normal            Nose- Clear, no-Septal dev, mucus, polyps, erosion, perforation             Throat- Mallampati II , mucosa clear , drainage- none, tonsils- atrophic Neck- flexible , trachea midline, no stridor , thyroid nl, carotid no bruit Chest - symmetrical excursion , unlabored           Heart/CV- RRR , no murmur , no gallop  , no rub, nl s1 s2                           - JVD- none , edema- none, stasis changes- none, varices- none           Lung- clear to P&A, wheeze- none, + dry cough with deep breath , dullness-none, rub- none           Chest wall-  Abd-  Br/ Gen/ Rectal- Not done, not indicated Extrem- cyanosis- none, clubbing, none, atrophy- none, strength- nl Neuro- grossly intact to observation

## 2012-01-14 NOTE — Assessment & Plan Note (Signed)
It is up to her to reschedule when she feels able.

## 2012-01-14 NOTE — Assessment & Plan Note (Signed)
Plan-repeat CT chest in 6 months. She understands and participated in this decision about timing

## 2012-01-26 DIAGNOSIS — K635 Polyp of colon: Secondary | ICD-10-CM | POA: Insufficient documentation

## 2012-01-26 DIAGNOSIS — N879 Dysplasia of cervix uteri, unspecified: Secondary | ICD-10-CM | POA: Insufficient documentation

## 2012-01-26 NOTE — Assessment & Plan Note (Addendum)
Anticipatory guidance --- exercise, weight loss.  Pap smear obtained; refer for mammogram. Colonoscopy due last year; to call to schedule repeat colonoscopy.  Immunizations UTD: to clarify date of TDAP.  Obtain labs.

## 2012-01-26 NOTE — Assessment & Plan Note (Signed)
Due for repeat colonoscopy; to call for appointment.

## 2012-01-26 NOTE — Assessment & Plan Note (Signed)
Administered in office.

## 2012-01-26 NOTE — Assessment & Plan Note (Signed)
Chronic cough with recent recurrence.  Obtain CXR; refer to pulmonology to determine etiology.

## 2012-01-26 NOTE — Assessment & Plan Note (Signed)
Pap smear obtained due to history of cervical dysplasia warranting hysterectomy.  Refer for mammogram.

## 2012-01-26 NOTE — Assessment & Plan Note (Signed)
Stable; pap smear obtained.

## 2012-01-27 NOTE — Progress Notes (Signed)
Reviewed and agree.

## 2012-01-31 ENCOUNTER — Other Ambulatory Visit: Payer: Self-pay | Admitting: Family Medicine

## 2012-02-28 ENCOUNTER — Ambulatory Visit (INDEPENDENT_AMBULATORY_CARE_PROVIDER_SITE_OTHER): Payer: BC Managed Care – PPO | Admitting: Family Medicine

## 2012-02-28 VITALS — BP 146/80 | HR 57 | Temp 98.3°F | Resp 16 | Ht 59.25 in | Wt 153.4 lb

## 2012-02-28 DIAGNOSIS — J069 Acute upper respiratory infection, unspecified: Secondary | ICD-10-CM

## 2012-02-28 DIAGNOSIS — R05 Cough: Secondary | ICD-10-CM

## 2012-02-28 DIAGNOSIS — I10 Essential (primary) hypertension: Secondary | ICD-10-CM

## 2012-02-28 DIAGNOSIS — J9801 Acute bronchospasm: Secondary | ICD-10-CM

## 2012-02-28 DIAGNOSIS — R918 Other nonspecific abnormal finding of lung field: Secondary | ICD-10-CM

## 2012-02-28 DIAGNOSIS — R059 Cough, unspecified: Secondary | ICD-10-CM

## 2012-02-28 LAB — POCT CBC
Hemoglobin: 12.2 g/dL (ref 12.2–16.2)
MCH, POC: 27.5 pg (ref 27–31.2)
MPV: 8.5 fL (ref 0–99.8)
POC Granulocyte: 4.6 (ref 2–6.9)
POC MID %: 5.9 %M (ref 0–12)
RBC: 4.43 M/uL (ref 4.04–5.48)
WBC: 7.9 10*3/uL (ref 4.6–10.2)

## 2012-02-28 LAB — POCT INFLUENZA A/B: Influenza B, POC: NEGATIVE

## 2012-02-28 LAB — POCT RAPID STREP A (OFFICE): Rapid Strep A Screen: NEGATIVE

## 2012-02-28 MED ORDER — ACETAMINOPHEN-CODEINE #3 300-30 MG PO TABS: 1.0000 | ORAL_TABLET | Freq: Four times a day (QID) | ORAL | Status: DC | PRN

## 2012-02-28 MED ORDER — BENZONATATE 100 MG PO CAPS: 100.0000 mg | ORAL_CAPSULE | Freq: Three times a day (TID) | ORAL | Status: DC | PRN

## 2012-02-28 MED ORDER — AMOXICILLIN-POT CLAVULANATE 875-125 MG PO TABS: 1.0000 | ORAL_TABLET | Freq: Two times a day (BID) | ORAL | Status: DC

## 2012-02-28 MED ORDER — OLMESARTAN MEDOXOMIL 20 MG PO TABS: 10.0000 mg | ORAL_TABLET | Freq: Every day | ORAL | Status: DC

## 2012-02-28 NOTE — Patient Instructions (Addendum)
1. Cough  benzonatate (TESSALON) 100 MG capsule, acetaminophen-codeine (TYLENOL #3) 300-30 MG per tablet  2. URI (upper respiratory infection)  POCT CBC, POCT rapid strep A, POCT Influenza A/B, amoxicillin-clavulanate (AUGMENTIN) 875-125 MG per tablet     1.  NEBULIZER THREE TIMES DAILY FOR NEXT WEEK. 2.  ADVAIR TWICE DAILY FOR NEXT TWO WEEKS. 3. CONTINUE SUDAFED/MUCINEX AS USING.

## 2012-02-28 NOTE — Progress Notes (Signed)
[address]   [place], [place]  [address]   [phone]  Subjective:    Patient ID: Tammy [name], female    DOB: 1955/05/31, 56 y.o.   MRN: [id number]  HPIThis 56 y.o. female presents for evaluation of cough, congestion.  Onset one month ago; using Sudafed/Guafenesin.  S/p consultation by Dr. Fannie Knee who prescribed Ultram but has not filled yet in 12/2011.  Now having coughing fits.  Feeling tightness in chest; was getting tightness in chest; no sputum. +bloody discharge in nose; +sinus pressure.  For past two days, +chills.  No fever.  Mild headache.  +ST started one month ago; +hoarseness.  ST returned two days ago.  +ear pain from sore throat.  +nasal congestion.  Flonase, nasal saline spray.  One tablet of Sudafed stable.  Has not used nebulizer with acute illness; using Proventil as needed; using daily once.  Using granddaughter's Advair for past month.  Has been holding Lisinopril since 12/2011.  Has not contacted Dr. Maple Hudson with acute coughing yet.  Felt really horrible yesterday.  2.  Lung nodule:  Recommend repeat six months.  Hesitant to wait six months for repeat CT chest.  No tobacco.   Review of Systems  Constitutional: Positive for chills. Negative for fever, diaphoresis and fatigue.  HENT: Positive for congestion, sore throat, rhinorrhea, sneezing, trouble swallowing, voice change and postnasal drip.   Respiratory: Positive for cough, chest tightness and wheezing. Negative for shortness of breath.   Cardiovascular: Negative for chest pain, palpitations and leg swelling.  Gastrointestinal: Negative for nausea, vomiting and diarrhea.  Skin: Negative for rash.  Neurological: Negative for dizziness, light-headedness and headaches.        Past Medical History  Diagnosis Date  . Gastric ulcer, unspecified as acute or chronic, without mention of hemorrhage, perforation, or obstruction   . Dysplasia of cervix, unspecified   . Personal history of colonic polyps   .  Irritable bowel syndrome   . Essential hypertension, benign   . Carpal tunnel syndrome   . Insomnia, unspecified   . Esophageal reflux   . Diaphragmatic hernia without mention of obstruction or gangrene   . Pure hypercholesterolemia   . Allergic rhinitis, cause unspecified   . Mixed incontinence urge and stress (female)(female)   . Chronic interstitial cystitis   . Methicillin resistant Staphylococcus aureus in conditions classified elsewhere and of unspecified site   . Hiatal hernia   . Colon polyps 03/07/2004    Past Surgical History  Procedure Date  . Tonsillectomy and adenoidectomy   . Carpal tunnel release   . Abdominal hysterectomy 03/07/2000    CERVICAL DYSPLASIA; OVARIES INTACT  . Colonoscopy 03/07/2004    colon polyps single.  English.    Prior to Admission medications   Medication Sig Start Date End Date Taking? Authorizing Provider  acetaminophen-codeine (TYLENOL #3) 300-30 MG per tablet Take 1 tablet by mouth every 6 (six) hours as needed.   Yes Historical Provider, MD  albuterol (PROVENTIL HFA;VENTOLIN HFA) 108 (90 BASE) MCG/ACT inhaler Inhale 2 puffs into the lungs every 6 (six) hours as needed.   Yes Historical Provider, MD  albuterol (PROVENTIL) (2.5 MG/3ML) 0.083% nebulizer solution Take 2.5 mg by nebulization every 6 (six) hours as needed.   Yes Historical Provider, MD  aspirin 81 MG tablet Take 81 mg by mouth daily.   Yes Historical Provider, MD  fish oil-omega-3 fatty acids 1000 MG capsule Take 2 g by mouth daily.   Yes Historical Provider, MD  fluticasone (FLONASE) 50 MCG/ACT nasal spray Place 2 sprays into the nose daily.   Yes Historical Provider, MD  Fluticasone-Salmeterol (ADVAIR) 250-50 MCG/DOSE AEPB Inhale 1 puff into the lungs every 12 (twelve) hours.   Yes Historical Provider, MD  lovastatin (MEVACOR) 40 MG tablet TAKE 1 TABLET BY MOUTH DAILY 04/29/11  Yes Peter M Swazil[place], MD  Multiple Vitamin (MULTIVITAMIN) tablet Take 1 tablet by mouth daily.   Yes Historical  Provider, MD  OXYGEN-HELIUM IN Inhale 2 L/min into the lungs at bedtime.   Yes Historical Provider, MD  pantoprazole (PROTONIX) 40 MG tablet TAKE 1 TABLET BY MOUTH TWICE DAILY 01/31/12  Yes Eleanore E Egan, PA-C  zolpidem (AMBIEN) 10 MG tablet Take 10 mg by mouth at bedtime as needed.   Yes Historical Provider, MD  budesonide-formoterol (SYMBICORT) 80-4.5 MCG/ACT inhaler Inhale 2 puffs into the lungs 2 (two) times daily.    Historical Provider, MD  mometasone (NASONEX) 50 MCG/ACT nasal spray Place 2 sprays into the nose daily.    Historical Provider, MD  olmesartan (BENICAR) 20 MG tablet Take 10 mg by mouth daily.    Historical Provider, MD  traMADol (ULTRAM) 50 MG tablet Take 1 tablet (50 mg total) by mouth every 6 (six) hours as needed for pain (cough). 11/21/11 11/20/12  Waymon Budge, MD    Allergies  Allergen Reactions  . Septra Ds (Sulfamethoxazole-Tmp Ds) Nausea Only    History   Social History  . Marital Status: Married    Spouse Name: N/A    Number of Children: 2  . Years of Education: N/A   Occupational History  . 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 Smoking status: Never Smoker   . Smokeless tobacco: Not on file  . Alcohol Use: Yes     Comment: occasional once per month on average. Twisted Tea.  . Drug Use: Not on file  . Sexually Active: Not on file   Other Topics Concern  . Not on file   Social History Narrative   Exercise: 3-5 days per week walking with husband. Married x 36 years; happy; no abuse. Husband on long term disability. Pt's daughter and grandchild also live in the home.  2 children,1 stepchild and 5 grandchildren.  No tobacco history; +drinks twice monthly; no drugs.  Exercise:  sporadically    Family History  Problem Relation Age of Onset  . Hyperlipidemia Mother   . Hypertension Mother   . Hypertension Father   . Coronary artery disease Father     stents  . Heart  disease Father 62    stenting  . Hyperlipidemia Father     Objective:   Physical Exam  Nursing note and vitals reviewed. Constitutional: She is oriented to person, place, and time. She appears well-developed and well-nourished. No distress.  HENT:  Head: Normocephalic and atraumatic.  Right Ear: External ear normal.  Left Ear: External ear normal.  Nose: Nose normal.  Mouth/Throat: Oropharynx is clear and moist. No oropharyngeal exudate.  Eyes: Conjunctivae normal are normal. Pupils are equal, round, and reactive to light.  Neck: Normal range of motion. Neck supple.  Cardiovascular: Normal rate, regular rhythm and normal heart sounds.  Exam reveals no gallop and no friction rub.   No murmur heard. Pulmonary/Chest: Effort normal and breath sounds normal. No respiratory distress. She has no wheezes. She has no rales.  Lymphadenopathy:    She has cervical adenopathy.  Neurological: She is alert and oriented to person, place, and time.  Skin: Skin is warm and dry. No rash noted. She is not diaphoretic.  Psychiatric: She has a normal mood and affect. Her behavior is normal.    XOPENEX 0.63MG  NEBULIZER ADMINISTERED DURING VISIT.  Results for orders placed in visit on 02/28/12  POCT CBC      Component Value Range   WBC 7.9  4.6 - 10.2 K/uL   Lymph, poc 2.8  0.6 - 3.4   POC LYMPH PERCENT 35.4  10 - 50 %L   MID (cbc) 0.5  0 - 0.9   POC MID % 5.9  0 - 12 %M   POC Granulocyte 4.6  2 - 6.9   Granulocyte percent 58.7  37 - 80 %G   RBC 4.43  4.04 - 5.48 M/uL   Hemoglobin 12.2  12.2 - 16.2 g/dL   HCT, POC 16.1  09.6 - 47.9 %   MCV 90.5  80 - 97 fL   MCH, POC 27.5  27 - 31.2 pg   MCHC 30.4 (*) 31.8 - 35.4 g/dL   RDW, POC 04.5     Platelet Count, POC 314  142 - 424 K/uL   MPV 8.5  0 - 99.8 fL  POCT RAPID STREP A (OFFICE)      Component Value Range   Rapid Strep A Screen Negative  Negative  POCT INFLUENZA A/B      Component Value Range   Influenza A, POC Negative     Influenza B,  POC Negative         Assessment & Plan:   1. Cough  benzonatate (TESSALON) 100 MG capsule, acetaminophen-codeine (TYLENOL #3) 300-30 MG per tablet  2. URI (upper respiratory infection)  POCT CBC, POCT rapid strep A, POCT Influenza A/B, amoxicillin-clavulanate (AUGMENTIN) 875-125 MG per tablet  3. Essential hypertension, benign    4. Bronchospasm    5. Lung nodules   1. URI/acute sinusitis:  New.  Onset one month ago.  Rx for Augmentin bid.  Continue Sudafed/Mucinex tablet daily.  Supportive care with rest, fluids.  Continue Flonase, nasal saline. 2. Cough:  New/recurrent.  Continue Mucinex; rx for Tessalon Perles tid scheduled; rx for Tylenol #3; also has Ultram prescribed by pulmonology for chronic cough.   3.  Bronchospasm:  Recurrent; improved with Xopenex neb in office; advised to start Advair 250/50 bid for next two weeks; recommend albuterol nebulizer tid for next week.   4.  HTN:  Moderately controlled. ACE-I stopped due to chronic cough; will refill Benicar 20mg  1/2 daily. 5.  Lung nodules: New. S/p pulmonology consultation in 12/2011; agree to repeat CT chest in six months.    Meds ordered this encounter  Medications  . fluticasone (FLONASE) 50 MCG/ACT nasal spray    Sig: Place 2 sprays into the nose daily.  . Fluticasone-Salmeterol (ADVAIR) 250-50 MCG/DOSE AEPB    Sig: Inhale 1 puff into the lungs every 12 (twelve) hours.  Marland Kitchen amoxicillin-clavulanate (AUGMENTIN) 875-125 MG per tablet    Sig: Take 1 tablet by mouth 2 (two) times daily.    Dispense:  20 tablet    Refill:  0  . benzonatate (TESSALON) 100 MG capsule    Sig: Take 1-2 capsules (100-200 mg total) by mouth 3 (three) times daily as needed for cough.    Dispense:  60 capsule    Refill:  1  . acetaminophen-codeine (TYLENOL #3) 300-30 MG per tablet    Sig: Take 1 tablet  by mouth every 6 (six) hours as needed.    Dispense:  40 tablet    Refill:  0  . olmesartan (BENICAR) 20 MG tablet    Sig: Take 0.5 tablets (10 mg  total) by mouth daily.    Dispense:  30 tablet    Refill:  5

## 2012-03-01 ENCOUNTER — Other Ambulatory Visit: Payer: Self-pay | Admitting: Family Medicine

## 2012-03-09 NOTE — Telephone Encounter (Signed)
Discussed in detail at visit in 02/2012.  Agree with plan to repeat CT chest in six months. Pt comfortable with this plan at this time.  No further action warranted until CT chest repeat at six months. KMS

## 2012-04-15 ENCOUNTER — Other Ambulatory Visit: Payer: Self-pay | Admitting: Physician Assistant

## 2012-05-04 ENCOUNTER — Telehealth: Payer: Self-pay | Admitting: *Deleted

## 2012-05-04 NOTE — Telephone Encounter (Signed)
[facility] requesting refill on ambien 10mg .

## 2012-05-06 MED ORDER — ZOLPIDEM TARTRATE 5 MG PO TABS: 5.0000 mg | ORAL_TABLET | Freq: Every evening | ORAL | Status: DC | PRN

## 2012-05-06 NOTE — Telephone Encounter (Signed)
1. Please call in rx for Ambien 5mg  one po qhs #30 5 refills; I printed rx but am out of office to sign it.  2. Please call pt: please advise her that new guideline out limiting Ambien to 5mg  daily for females.  I have decreased her dose to 5mg .

## 2012-05-07 NOTE — Telephone Encounter (Signed)
Called her. Left message about dose change if she has ? She will call me. Called in to [facility].

## 2012-05-08 ENCOUNTER — Ambulatory Visit: Payer: Self-pay | Admitting: Cardiology

## 2012-05-09 ENCOUNTER — Other Ambulatory Visit: Payer: Self-pay | Admitting: Cardiology

## 2012-05-14 ENCOUNTER — Ambulatory Visit: Payer: Self-pay | Admitting: Family Medicine

## 2012-05-15 ENCOUNTER — Other Ambulatory Visit: Payer: Self-pay

## 2012-05-22 ENCOUNTER — Ambulatory Visit: Payer: BC Managed Care – PPO | Admitting: Family Medicine

## 2012-06-01 ENCOUNTER — Ambulatory Visit (INDEPENDENT_AMBULATORY_CARE_PROVIDER_SITE_OTHER): Payer: BC Managed Care – PPO | Admitting: Internal Medicine

## 2012-06-01 ENCOUNTER — Encounter: Payer: Self-pay | Admitting: Internal Medicine

## 2012-06-01 ENCOUNTER — Ambulatory Visit (INDEPENDENT_AMBULATORY_CARE_PROVIDER_SITE_OTHER)
Admission: RE | Admit: 2012-06-01 | Discharge: 2012-06-01 | Disposition: A | Discharge status: Home / Self Care | Payer: BC Managed Care – PPO | Source: Ambulatory Visit | Attending: Internal Medicine | Admitting: Internal Medicine

## 2012-06-01 VITALS — BP 124/80 | HR 62 | Ht 59.75 in | Wt 157.8 lb

## 2012-06-01 DIAGNOSIS — R911 Solitary pulmonary nodule: Secondary | ICD-10-CM

## 2012-06-01 DIAGNOSIS — R05 Cough: Secondary | ICD-10-CM

## 2012-06-01 NOTE — Assessment & Plan Note (Addendum)
These nodules will probably prove to be benign. She worries about them because husband has cancer (pancreas). We agreed to one more f/u CT in 1 year- Spring 2015

## 2012-06-01 NOTE — Patient Instructions (Addendum)
When you return in the fall, we will schedule one last follow-up CT for March of 2015 to check the lung nodules.  We can continue with your bronchodilator medicines.  Try using an otc antihistamine like claritin/ loratadine to see if it helps the sense of mild chest congestion.

## 2012-06-01 NOTE — Progress Notes (Signed)
11/21/11- 4 yoF never smoker referred courtesy of Dr.Kristi Smith/ [facility] with complaint of coughing. Cough x 2-3 years; "smokers cough" in morning-NEVER smoked; Dr Nilda Simmer.   Husband here Wife of our patient Tammy [name]. Has already had flu and pneumonia vaccines.  Describes 2 years of chronic cough with chest tightness but she does not hear wheezing. Albuterol inhaler and nebulizer had helped in the past. She was started on 2 L of oxygen at night based on overnight oximetry. Cough is usually dry. . Reports that she snores, father snores, and she is bothered by daytime sleepiness. Husband raises question of sleep apnea. Denies history of seasonal rhinitis. Describes "dry sinus headache" which she treats with saline spray. Does not wake up choking or strangled but does have a history of GERD with hiatal hernia and history of peptic ulcer disease. Does not notice choking or coughing with meals. Takes lisinopril/ACE inhibitor and we discussed potential for cough without drug class. 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 Denies family history of respiratory disease. Chest x-ray by Dr. Katrinka Blazing had question posterior lung nodule versus osteophyte. CXR 11/20/11- reviewed IMPRESSION:  1 x 2 cm oval opacity overlies the upper thoracic spine on the  lateral projection. This may be related to spinal osteophytes, but  a posterior parenchymal nodule cannot be excluded. CT chest  without contrast recommended to further evaluate.  Clinically significant discrepancy from primary report, if  provided: Nodular density overlying the thoracic spine on the  lateral projection.  Original Report Authenticated By: ERIC A. MANSELL, M.D.   01/02/12- 80 yoF never smoker followed for cough, ? Lung nodule   Husband here Denies any increased cough-feels like BP med is helping this now; unable  to have sleep study at this time due to personal/family issues; will call to [facility] once everything is  cleared up. Less coughing off lisinopril/changed to Benicar. Not needing tramadol for cough. CT- 11/21/11-reviewed with them IMPRESSION:  1. Three pulmonary nodules include a 5 mm right lower lobe nodule,  3 mm right lower lobe nodule, and 5 mm left lower lobe nodule.  Left lower lobe nodule is immediately along the major fissure,  providing increased testicle likelihood of being a subpleural lymph  node. If the patient is at high risk for bronchogenic carcinoma,  follow-up chest CT at 6-12 months is recommended. If the patient  is at low risk for bronchogenic carcinoma, follow-up chest CT at 12  months is recommended. This recommendation follows the consensus  statement: Guidelines for Management of Small Pulmonary Nodules  Detected on CT Scans: A Statement from the Fleischner Society as  published in Radiology 2005; 237:395-400.  2. If prior chest radiograph and / or report can be provided, we  are happy to review these and issue an addendum directly comparing  the findings.  Original Report Authenticated By: Dellia Cloud, M.D.  Addend- The density projecting over the anterior thoracic spine at the T6-7  level on the radiographs appears to correspond to a moderately  prominent thoracic spur as shown on images 56-58 of series 603.  This does not correspond to the small nodules shown on the recent  CT scan, which are not readily visible on chest radiography due to  their small size. Original recommendations regarding follow-up of  these nodules from the initial CT chest report remain in force.  **END ADDENDUM** SIGNED BY  06/01/12-57 yoF never smoker followed for cough,  Lung nodules  FOLLOWS FOR: had CT chest  done this morning at 10am-no report back yet; starting to have cough again.  Husband here. Cough stopped and was gone for months after she stopped lisinopril. Just in the last week she has begun dry cough and supsects pollen. No significant past hx of allergic rhinitis.  Never wheezes. We reviewed hx of her lung nodules.  CT chest 06/01/12- images reviewed with pt and husband IMPRESSION:  Stable appearance of two tiny nodules in the right lower lobe since  10/15/2007. These nodules are consistent with benign disease, most  likely related to scarring.  Interval decrease in size of the 2 x 6 mm nodule on the left major  fissure. This is most suggestive of a subpleural lymph node.  Additional follow-up CT without contrast could be performed in 12  months per previous recommendations, to insure continued stability.  Original Report Authenticated By: Kennith Center, M.D.   ROS-see HPI Constitutional:   No-   weight loss, night sweats, fevers, chills, fatigue, lassitude. HEENT:   +  headaches, no-difficulty swallowing, tooth/dental problems, sore throat,       No-  sneezing, itching, ear ache, nasal congestion, post nasal drip,  CV:  No-   chest pain, orthopnea, PND, swelling in lower extremities, anasarca,  dizziness, +palpitations Resp: No-   shortness of breath with exertion or at rest.            No-  productive cough, No non-productive cough at this visit,  No- coughing up of blood.              No-   change in color of mucus.  No- wheezing.   Skin: No-   rash or lesions. GI:  + heartburn, indigestion, no-abdominal pain, nausea, vomiting,  GU: . MS:  No-   joint pain or swelling.   Neuro-     nothing unusual Psych:  No- change in mood or affect. No depression or anxiety.  No memory loss.  OBJ- Physical Exam General- Alert, Oriented, Affect-appropriate, Distress- none acute Skin- rash-none, lesions- none, excoriation- none Lymphadenopathy- none Head- atraumatic            Eyes- Gross vision intact, PERRLA, conjunctivae and secretions clear            Ears- Hearing, canals-normal            Nose- Clear, no-Septal dev, mucus, polyps, erosion, perforation             Throat- Mallampati III , mucosa clear , drainage- none, tonsils- atrophic Neck- flexible  , trachea midline, no stridor , thyroid nl, carotid no bruit Chest - symmetrical excursion , unlabored           Heart/CV- RRR , no murmur , no gallop  , no rub, nl s1 s2                           - JVD- none , edema- none, stasis changes- none, varices- none           Lung- clear to P&A, wheeze- none, No- cough with deep breath , dullness-none, rub- none           Chest wall-  Abd-  Br/ Gen/ Rectal- Not done, not indicated Extrem- cyanosis- none, clubbing, none, atrophy- none, strength- nl Neuro- grossly intact to observation

## 2012-06-01 NOTE — Assessment & Plan Note (Signed)
Try claritin while continuing current bronchodilator therapy.

## 2012-06-08 ENCOUNTER — Ambulatory Visit: Payer: Self-pay | Admitting: Cardiology

## 2012-06-14 ENCOUNTER — Other Ambulatory Visit: Payer: Self-pay

## 2012-06-14 MED ORDER — LOVASTATIN 40 MG PO TABS: 40.0000 mg | ORAL_TABLET | Freq: Every day | ORAL | Status: DC

## 2012-06-18 ENCOUNTER — Ambulatory Visit: Payer: BC Managed Care – PPO | Admitting: Family Medicine

## 2012-07-25 ENCOUNTER — Other Ambulatory Visit: Payer: Self-pay | Admitting: Cardiology

## 2012-07-26 NOTE — Telephone Encounter (Signed)
lovastatin (MEVACOR) 40 MG tablet 30 tablet 1 06/14/2012      Sig - Route:  Take 1 tablet (40 mg total) by mouth daily. - Oral    Class:  Normal    Comment:  Keep follow up office visit to continue to receive further refills    Authorizing Provider:  Peter M Swaziland, MD    Ordering User:  Joanette Gula, CMA

## 2012-08-13 ENCOUNTER — Ambulatory Visit: Payer: BC Managed Care – PPO

## 2012-08-13 ENCOUNTER — Other Ambulatory Visit: Payer: Self-pay | Admitting: Family Medicine

## 2012-08-13 ENCOUNTER — Encounter: Payer: Self-pay | Admitting: Family Medicine

## 2012-08-13 ENCOUNTER — Ambulatory Visit (INDEPENDENT_AMBULATORY_CARE_PROVIDER_SITE_OTHER): Payer: BC Managed Care – PPO | Admitting: Family Medicine

## 2012-08-13 VITALS — BP 162/100 | HR 63 | Temp 98.9°F | Resp 16 | Ht 59.5 in | Wt 154.9 lb

## 2012-08-13 DIAGNOSIS — M545 Low back pain: Secondary | ICD-10-CM

## 2012-08-13 DIAGNOSIS — M542 Cervicalgia: Secondary | ICD-10-CM

## 2012-08-13 DIAGNOSIS — I10 Essential (primary) hypertension: Secondary | ICD-10-CM

## 2012-08-13 DIAGNOSIS — Z23 Encounter for immunization: Secondary | ICD-10-CM

## 2012-08-13 DIAGNOSIS — R002 Palpitations: Secondary | ICD-10-CM

## 2012-08-13 DIAGNOSIS — E785 Hyperlipidemia, unspecified: Secondary | ICD-10-CM | POA: Insufficient documentation

## 2012-08-13 DIAGNOSIS — M25519 Pain in unspecified shoulder: Secondary | ICD-10-CM

## 2012-08-13 DIAGNOSIS — M25511 Pain in right shoulder: Secondary | ICD-10-CM

## 2012-08-13 DIAGNOSIS — E7800 Pure hypercholesterolemia, unspecified: Secondary | ICD-10-CM

## 2012-08-13 DIAGNOSIS — G4700 Insomnia, unspecified: Secondary | ICD-10-CM | POA: Insufficient documentation

## 2012-08-13 LAB — CBC WITH DIFFERENTIAL/PLATELET
Basophils Absolute: 0 10*3/uL (ref 0.0–0.1)
Eosinophils Absolute: 0.2 10*3/uL (ref 0.0–0.7)
Eosinophils Relative: 3 % (ref 0–5)
HCT: 35.7 % — ABNORMAL LOW (ref 36.0–46.0)
Lymphocytes Relative: 35 % (ref 12–46)
Lymphs Abs: 2 10*3/uL (ref 0.7–4.0)
MCH: 28.2 pg (ref 26.0–34.0)
MCV: 85.2 fL (ref 78.0–100.0)
Monocytes Absolute: 0.5 10*3/uL (ref 0.1–1.0)
Platelets: 274 10*3/uL (ref 150–400)
RDW: 14.2 % (ref 11.5–15.5)
WBC: 5.7 10*3/uL (ref 4.0–10.5)

## 2012-08-13 MED ORDER — LOVASTATIN 40 MG PO TABS: 40.0000 mg | ORAL_TABLET | Freq: Every day | ORAL | Status: DC

## 2012-08-13 MED ORDER — LOSARTAN POTASSIUM 50 MG PO TABS: 50.0000 mg | ORAL_TABLET | Freq: Every day | ORAL | Status: DC

## 2012-08-13 MED ORDER — ACETAMINOPHEN-CODEINE #3 300-30 MG PO TABS: 1.0000 | ORAL_TABLET | Freq: Four times a day (QID) | ORAL | Status: DC | PRN

## 2012-08-13 MED ORDER — METAXALONE 800 MG PO TABS: 800.0000 mg | ORAL_TABLET | Freq: Three times a day (TID) | ORAL | Status: DC

## 2012-08-13 MED ORDER — METOPROLOL TARTRATE 25 MG PO TABS: 25.0000 mg | ORAL_TABLET | Freq: Two times a day (BID) | ORAL | Status: DC

## 2012-08-13 MED ORDER — TRAZODONE HCL 50 MG PO TABS: 50.0000 mg | ORAL_TABLET | Freq: Every evening | ORAL | Status: DC | PRN

## 2012-08-13 MED ORDER — FUROSEMIDE 20 MG PO TABS: 20.0000 mg | ORAL_TABLET | Freq: Every day | ORAL | Status: DC

## 2012-08-13 MED ORDER — PANTOPRAZOLE SODIUM 40 MG PO TBEC: 40.0000 mg | DELAYED_RELEASE_TABLET | Freq: Two times a day (BID) | ORAL | Status: DC

## 2012-08-13 NOTE — Progress Notes (Signed)
[address]   [place], [place]  [address]   [phone]  Subjective:    Patient ID: Tammy [name], female    DOB: 06/15/1955, 57 y.o.   MRN: [id number]  HPI This 57 y.o. female presents for six month follow-up:  1.  HTN:  Lisinopril stopped and replaced with Benicar.  Benicar too expensive.  Has had to reschedule appointments.  Swelling in legs.  Hand swelling.  Has palpitations; hs been taking husband's Metoprolol.  Has been out of Benicar since October 2013.    2.  Lung nodules:  Repeat CT scan 05/2012; decreased size of lung nodules.  Recommended repeat CT scan in one year 05/2013.    3.  Cough:  No cough since 02/2012 when got acutely ill.  Did take a while for cough to resolve.  No really bad fits of coughing with cold.  Did use Advair 250 for two weeks; Albuterol does not seem to help.    4.  Hypercholesterolemia:  Taking Lovastatin.  Not fasting today.  5.  Insomnia:  Ambien 5mg  not working.  Tried adding Melatonin but causes headache; Benadryl also causes headache. Has never tried Trazodone.    6.  Low back pain: worse with bending over; has back spasms in lower back and hip R.  Radiation into leg intermittently.  History of cervical neck disc with radiculopathy; s/p NS consultation.  +n/t in R lateral leg but thinks due to spider veins.  Taking a lot of Aleve.  No recent xrays.    7.  RUE pain.  Taking aleve a lot for neck and back pain.  No n/t.  No recent xrays.  Pain with movement of R shoulder.  8.  Requesting TDAP.   Review of Systems  Constitutional: Negative for fever, chills, diaphoresis and fatigue.  Eyes: Negative for visual disturbance.  Respiratory: Negative for cough, shortness of breath, wheezing and stridor.   Cardiovascular: Positive for chest pain, palpitations and leg swelling.  Genitourinary: Positive for frequency and hematuria. Negative for dysuria and urgency.  Musculoskeletal: Positive for myalgias, back pain and arthralgias. Negative for joint  swelling.  Neurological: Positive for numbness. Negative for dizziness, tremors, seizures, syncope, facial asymmetry, speech difficulty, weakness and headaches.  Psychiatric/Behavioral: Positive for sleep disturbance. Negative for dysphoric mood. The patient is not nervous/anxious.     Past Medical History  Diagnosis Date  . Gastric ulcer, unspecified as acute or chronic, without mention of hemorrhage, perforation, or obstruction   . Dysplasia of cervix, unspecified   . Personal history of colonic polyps   . Irritable bowel syndrome   . Essential hypertension, benign   . Carpal tunnel syndrome   . Insomnia, unspecified   . Esophageal reflux   . Diaphragmatic hernia without mention of obstruction or gangrene   . Pure hypercholesterolemia   . Allergic rhinitis, cause unspecified   . Mixed incontinence urge and stress (female)(female)   . Chronic interstitial cystitis   . Methicillin resistant Staphylococcus aureus in conditions classified elsewhere and of unspecified site   . Hiatal hernia   . Colon polyps 03/07/2004    Past Surgical History  Procedure Laterality Date  . Tonsillectomy and adenoidectomy    . Carpal tunnel release    . Abdominal hysterectomy  03/07/2000    CERVICAL DYSPLASIA; OVARIES INTACT  . Colonoscopy  03/07/2004    colon polyps single.  [place].    Prior to Admission medications   Medication Sig Start Date End Date Taking? Authorizing Provider  acetaminophen-codeine (TYLENOL #3) 300-30 MG per tablet Take 1 tablet by mouth every 6 (six) hours as needed. Need refills 08/13/12  Yes Ethelda Chick, MD  albuterol (PROVENTIL HFA;VENTOLIN HFA) 108 (90 BASE) MCG/ACT inhaler Inhale 2 puffs into the lungs every 6 (six) hours as needed.   Yes Historical Provider, MD  albuterol (PROVENTIL) (2.5 MG/3ML) 0.083% nebulizer solution Take 2.5 mg by nebulization every 6 (six) hours as needed.   Yes Historical Provider, MD  aspirin 81 MG tablet Take 81 mg by mouth daily.   Yes Historical  Provider, MD  benzonatate (TESSALON) 100 MG capsule Take 1-2 capsules (100-200 mg total) by mouth 3 (three) times daily as needed for cough. 02/28/12  Yes Ethelda Chick, MD  lovastatin (MEVACOR) 40 MG tablet Take 1 tablet (40 mg total) by mouth at bedtime. 08/13/12  Yes Ethelda Chick, MD  Multiple Vitamin (MULTIVITAMIN) tablet Take 1 tablet by mouth daily.   Yes Historical Provider, MD  pantoprazole (PROTONIX) 40 MG tablet Take 1 tablet (40 mg total) by mouth 2 (two) times daily. 08/13/12  Yes Ethelda Chick, MD  traMADol (ULTRAM) 50 MG tablet Take 1 tablet (50 mg total) by mouth every 6 (six) hours as needed for pain (cough). 11/21/11 11/20/12 Yes Clinton D Young, MD  zolpidem (AMBIEN) 5 MG tablet Take 1 tablet (5 mg total) by mouth at bedtime as needed for sleep. 05/06/12  Yes Ethelda Chick, MD  furosemide (LASIX) 20 MG tablet Take 1 tablet (20 mg total) by mouth daily. 08/13/12   Ethelda Chick, MD  losartan (COZAAR) 50 MG tablet Take 1 tablet (50 mg total) by mouth daily. 08/13/12   Ethelda Chick, MD  metoprolol tartrate (LOPRESSOR) 25 MG tablet Take 1 tablet (25 mg total) by mouth 2 (two) times daily. 08/13/12   Ethelda Chick, MD  olmesartan (BENICAR) 20 MG tablet Take 0.5 tablets (10 mg total) by mouth daily. 02/28/12   Ethelda Chick, MD  OXYGEN-HELIUM IN Inhale 2 L/min into the lungs at bedtime.    Historical Provider, MD  traZODone (DESYREL) 50 MG tablet Take 1 tablet (50 mg total) by mouth at bedtime as needed for sleep. 08/13/12   Ethelda Chick, MD    Allergies  Allergen Reactions  . Septra Ds (Sulfamethoxazole-Tmp Ds) Nausea Only    History   Social History  . Marital Status: Married    Spouse Name: N/A    Number of Children: 2  . Years of Education: N/A   Occupational History  . 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 Smoking status: Never Smoker   . Smokeless tobacco: Not on file  . Alcohol Use: Yes      Comment: occasional once per month on average. Twisted Tea.  . Drug Use: Not on file  . Sexually Active: Not on file   Other Topics Concern  . Not on file   Social History Narrative   Exercise: 3-5 days per week walking with husband. Married x 36 years; happy; no abuse. Husband on long term disability. Pt's daughter and grandchild also live in the home.  2 children,1 stepchild and 5 grandchildren.  No tobacco history; +drinks twice monthly; no drugs.  Exercise:  sporadically    Family History  Problem Relation Age of Onset  . Hyperlipidemia Mother   . Hypertension Mother   . Hypertension Father   . Coronary artery  disease Father     stents  . Heart disease Father 74    stenting  . Hyperlipidemia Father        Objective:   Physical Exam  Nursing note and vitals reviewed. Constitutional: She is oriented to person, place, and time. She appears well-developed and well-nourished. No distress.  Eyes: Conjunctivae and EOM are normal. Pupils are equal, round, and reactive to light.  Neck: Normal range of motion. Neck supple. No thyromegaly present.  Cardiovascular: Normal rate, regular rhythm, normal heart sounds and intact distal pulses.  Exam reveals no gallop and no friction rub.   No murmur heard. Pulmonary/Chest: Effort normal and breath sounds normal. She has no wheezes. She has no rales.  Musculoskeletal:       Right shoulder: She exhibits pain. She exhibits normal range of motion, no tenderness, no bony tenderness, no spasm, normal pulse and normal strength.       Cervical back: She exhibits tenderness, pain and spasm. She exhibits normal range of motion and no bony tenderness.       Lumbar back: She exhibits normal range of motion, no tenderness, no swelling, no pain and no spasm.  Cervical spine: +TTP midline; +TTP R trapezius. Motor 5/5 BUE. Lumbar spine: non-tender; full ROM; straight leg raises negative; toe and heel walking intact; marching intact. R shoulder: pain with  elevation above 90 degrees; empty can sign positive; cross over test +; motor 5/5.  Lymphadenopathy:    She has no cervical adenopathy.  Neurological: She is alert and oriented to person, place, and time. No cranial nerve deficit. She exhibits normal muscle tone. Coordination normal.  Skin: Rash noted. She is not diaphoretic.  Psychiatric: She has a normal mood and affect. Her behavior is normal.   [facility] reading (PRIMARY) by  Dr. Katrinka Blazing.  CERVICAL SPINE: DEGENERATIVE CHANGES WITH SPURRING MULTIPLE LEVELS.  LUMBAR SPINE: DEGENERATIVE CHANGES MULTIPLE LEVELS WITH SPURRING.  SHOULDER R FILMS:  NAD       Assessment & Plan:  Essential hypertension, benign - Plan: Comprehensive metabolic panel, losartan (COZAAR) 50 MG tablet, furosemide (LASIX) 20 MG tablet  Pure hypercholesterolemia - Plan: CBC with Differential, CK, Lipid panel, lovastatin (MEVACOR) 40 MG tablet  Low back pain - Plan: DG Lumbar Spine 2-3 Views  Neck pain - Plan: DG Cervical Spine 2 or 3 views  Pain in joint, shoulder region, right - Plan: DG Shoulder Right  Insomnia - Plan: traZODone (DESYREL) 50 MG tablet  Need for Tdap vaccination - Plan: Tdap vaccine greater than or equal to 7yo IM  Palpitations - Plan: metoprolol tartrate (LOPRESSOR) 25 MG tablet   1.  HTN: uncontrolled; rx for Losartan 50mg  one daily provided; rx for Lasix also provided for venous stasis. 2.  Hypercholesterolemia: controlled; refill provided; obtain labs. 3.  Low back pain/sprain:  New.  LS spine films obtained; recommend home exercise program and walking;  Tylenol #3 provided for PRN use. 4.  Neck pain/sprain:  New.  Cervical spine films obtained.  Recommend Aleve bid scheduled for two weeks; perform home exercise program. If no improvement in two weeks, call for ortho referral. 5.  R shoulder pain/strain:  New.  Recommend Aleve bid scheduled for two weeks and then PRN.  Avoid heavy lifting for the next two weeks.   6. Insomnia: uncontrolled with  Ambien 5mg  and Melatonin; rx for Trazodone provided.   7. Palpitations: stable; refill of Metoprolol provided. 8.  S/p TDAP.  Meds ordered this encounter  Medications  . DISCONTD:  acetaminophen-codeine (TYLENOL #3) 300-30 MG per tablet    Sig: Take 1 tablet by mouth every 6 (six) hours as needed. Need refills  . losartan (COZAAR) 50 MG tablet    Sig: Take 1 tablet (50 mg total) by mouth daily.    Dispense:  30 tablet    Refill:  5  . lovastatin (MEVACOR) 40 MG tablet    Sig: Take 1 tablet (40 mg total) by mouth at bedtime.    Dispense:  30 tablet    Refill:  5  . furosemide (LASIX) 20 MG tablet    Sig: Take 1 tablet (20 mg total) by mouth daily.    Dispense:  30 tablet    Refill:  3  . traZODone (DESYREL) 50 MG tablet    Sig: Take 1 tablet (50 mg total) by mouth at bedtime as needed for sleep.    Dispense:  30 tablet    Refill:  5  . metoprolol tartrate (LOPRESSOR) 25 MG tablet    Sig: Take 1 tablet (25 mg total) by mouth 2 (two) times daily.    Dispense:  60 tablet    Refill:  3  . pantoprazole (PROTONIX) 40 MG tablet    Sig: Take 1 tablet (40 mg total) by mouth 2 (two) times daily.    Dispense:  60 tablet    Refill:  11    CYCLE FILL MEDICATION.  Marland Kitchen acetaminophen-codeine (TYLENOL #3) 300-30 MG per tablet    Sig: Take 1 tablet by mouth every 6 (six) hours as needed. Need refills    Dispense:  30 tablet    Refill:  0

## 2012-08-13 NOTE — Patient Instructions (Signed)
Surgery for Rotator Cuff Tear  with Rehab  Rotator cuff surgery is only recommended for individuals who have experienced persistent disability for greater than 3 months of non-surgical (conservative) treatment. Surgery is not necessary but is recommended for individuals who experience difficulty completing daily activities or athletes who are unable to compete. Rotator cuff tears do not usually heal without surgical intervention. If left alone small rotator cuff tears usually become larger. Younger athletes who have a rotator cuff tear may be recommended for surgery without attempting conservative rehabilitation. The purpose of surgery is to regain function of the shoulder joint and eliminate pain associated with the injury. In addition to repairing the tendon tear, the surgery often removes a portion of the bony roof of the shoulder (acromion) as well as the chronically thickened and inflamed membrane below the acromion (subacromial bursa).  REASONS NOT TO OPERATE   · Infection of the shoulder.  · Inability to complete a rehabilitation program.  · Patients who have other conditions (emotional or psychological) conditions that contribute to their shoulder condition.  RISKS AND COMPLICATIONS  · Infection.  · Re-tear of the rotator cuff tendons or muscles.  · Shoulder stiffness and/or weakness.  · Inability to compete in athletics.  · Acromioclavicular (AC) joint paint.  · Risks of surgery: infection, bleeding, nerve damage, or damage to surrounding tissues.  TECHNIQUE  There are different surgical procedures used to treat rotator cuff tears. The type of procedure depends on the extent of injury as well as the surgeon's preference. All of the surgical techniques for rotator cuff tears have the same goal of repairing the torn tendon, removing part of the acromion, and removing the subacromial bursa. There are two main types of procedures: arthroscopic and open incision.  Arthroscopic procedures are usually completed  and you go home the same day as surgery (outpatient). These procedures use multiple small incisions in which tools and a video camera are placed to work on the shoulder. An electric shaver removes the bursa, then a power burr shaves down the portion of the acromion that places pressure on the rotator cuff. Finally the rotator cuff is sewed (sutured) back to the humeral head.  Open incision procedures require a larger incision. The deltoid muscle is detached from the acromion and a ligament in the shoulder (coracoacromial) is cut in order for the surgeon to access the rotator cuff. The subacromial bursa is removed as well as part of the acromion to give the rotator cuff room to move freely. The torn tendon is then sutured to the humeral head. After the rotator cuff is repaired, then the deltoid is reattached and the incision is closed up.   RECOVERY   · Post-operative care depends on the surgical technique and the preferences of your therapist.  · Keep the wound clean and dry for the first 10 to 14 days after surgery.  · Keep your shoulder and arm in the sling provided to you for as long as you have been instructed to.  · You will be given pain medications by your caregiver.  · Passive (without using muscles) shoulder movements may be begun immediately after surgery.  · It is important to follow through with you rehabilitation program in order to have the best possible recovery.  RETURN TO SPORTS   · The rehabilitation period will depend on the sport and position you play as well as the success of the operation.  · The minimum recovery period is 6 months.  · You must have   regained complete shoulder motion and strength before returning to sports.  SEEK IMMEDIATE MEDICAL CARE IF:   · Any medications produce adverse side effects.  · Any complications from surgery occur:  · Pain, numbness, or coldness in the extremity operated upon.  · Discoloration of the nail beds (they become blue or gray) of the extremity operated  upon.  · Signs of infections (fever, pain, inflammation, redness, or persistent bleeding).  EXERCISES   RANGE OF MOTION (ROM) AND STRETCHING EXERCISES - Rotator Cuff Tear, Surgery For  These exercises may help you restore your elbow mobility once your physician has discontinued your immobilization period. Beginning these before your provider's approval may result in delayed healing. Your symptoms may resolve with or without further involvement from your physician, physical therapist or athletic trainer. While completing these exercises, remember:   · Restoring tissue flexibility helps normal motion to return to the joints. This allows healthier, less painful movement and activity.  · An effective stretch should be held for at least 30 seconds. A stretch should never be painful. You should only feel a gentle lengthening or release in the stretch.  ROM - Pendulum   · Bend at the waist so that your right / left arm falls away from your body. Support yourself with your opposite hand on a solid surface, such as a table or a countertop.  · Your right / left arm should be perpendicular to the ground. If it is not perpendicular, you need to lean over farther. Relax the muscles in your right / left arm and shoulder as much as possible.  · Gently sway your hips and trunk so they move your right / left arm without any use of your right / left shoulder muscles.  · Progress your movements so that your right / left arm moves side to side, then forward and backward, and finally, both clockwise and counterclockwise.  · Complete __________ repetitions in each direction. Many people use this exercise to relieve discomfort in their shoulder as well as to gain range of motion.  Repeat __________ times. Complete this exercise __________ times per day.  STRETCH  Flexion, Seated   · Sit in a firm chair so that your right / left forearm can rest on a table or on a table or countertop. Your right / left elbow should rest below the height of  your shoulder so that your shoulder feels supported and not tense or uncomfortable.  · Keeping your right / left shoulder relaxed, lean forward at your waist, allowing your right / left hand to slide forward. Bend forward until you feel a moderate stretch in your shoulder, but before you feel an increase in your pain.  · Hold __________ seconds. Slowly return to your starting position.  Repeat __________ times. Complete this exercise __________ times per day.   STRETCH  Flexion, Standing   · Stand with good posture. With an underhand grip on your right / left and an overhand grip on the opposite hand, grasp a broomstick or cane so that your hands are a little more than shoulder-width apart.  · Keeping your right / left elbow straight and shoulder muscles relaxed, push the stick with your opposite hand to raise your right / left arm in front of your body and then overhead. Raise your arm until you feel a stretch in your right / left shoulder, but before you have increased shoulder pain.  · Try to avoid shrugging your right / left shoulder as your arm   rises by keeping your shoulder blade tucked down and toward your mid-back spine. Hold __________ seconds.  · Slowly return to the starting position.  Repeat __________ times. Complete this exercise __________ times per day.   STRETCH  Abduction, Supine   · Stand with good posture. With an underhand grip on your right / left and an overhand grip on the opposite hand, grasp a broomstick or cane so that your hands are a little more than shoulder-width apart.  · Keeping your right / left elbow straight and shoulder muscles relaxed, push the stick with your opposite hand to raise your right / left arm out to the side of your body and then overhead. Raise your arm until you feel a stretch in your right / left shoulder, but before you have increased shoulder pain.  · Try to avoid shrugging your right / left shoulder as your arm rises by keeping your shoulder blade tucked down  and toward your mid-back spine. Hold __________ seconds.  · Slowly return to the starting position.  Repeat __________ times. Complete this exercise __________ times per day.   ROM  Flexion, Active-Assisted  · Lie on your back. You may bend your knees for comfort.  · Grasp a broomstick or cane so your hands are about shoulder-width apart. Your right / left hand should grip the end of the stick/cane so that your hand is positioned "thumbs-up," as if you were about to shake hands.  · Using your healthy arm to lead, raise your right / left arm overhead until you feel a gentle stretch in your shoulder. Hold __________ seconds.  · Use the stick/cane to assist in returning your right / left arm to its starting position.  Repeat __________ times. Complete this exercise __________ times per day.   STRETCH  External Rotation   · Tuck a folded towel or small ball under your right / left upper arm. Grasp a broomstick or cane with an underhand grasp a little more than shoulder width apart. Bend your elbows to 90 degrees.  · Stand with good posture or sit in a chair without arms.  · Use your strong arm to push the stick across your body. Do not allow the towel or ball to fall. This will rotate your right / left arm away from your abdomen. Using the stick turn/rotate your hand and forearm away from your body. Hold __________ seconds.  Repeat __________ times. Complete this exercise __________ times per day.   STRENGTHENING EXERCISES - Rotator Cuff Tear, Surgery For  These exercises may help you begin to restore your elbow strength in the initial stage of your rehabilitation. Your physician will determine when you begin these exercises depending on the severity of your injury and the integrity of your repaired tissues. Beginning these before your provider's approval may result in delayed healing. While completing these exercises, remember:   · Muscles can gain both the endurance and the strength needed for everyday activities  through controlled exercises.  · Complete these exercises as instructed by your physician, physical therapist or athletic trainer. Progress the resistance and repetitions only as guided.  · You may experience muscle soreness or fatigue, but the pain or discomfort you are trying to eliminate should never worsen during these exercises. If this pain does worsen, stop and make certain you are following the directions exactly. If the pain is still present after adjustments, discontinue the exercise until you can discuss the trouble with your clinician.  STRENGTH - Shoulder Flexion, Isometric   ·   With good posture and facing a wall, stand or sit about 4-6 inches away.  · Keeping your right / left elbow straight, gently press the top of your fist into the wall. Increase the pressure gradually until you are pressing as hard as you can without shrugging your shoulder or increasing any shoulder discomfort.  · Hold __________ seconds. Release the tension slowly. Relax your shoulder muscles completely before you do the next repetition.  Repeat __________ times. Complete this exercise __________ times per day.   STRENGTH - Shoulder Abductors, Isometric   · With good posture, stand or sit about 4-6 inches from a wall with your right / left side facing the wall.  · Bend your right / left elbow. Gently press your right / left elbow into the wall. Increase the pressure gradually until you are pressing as hard as you can without shrugging your shoulder or increasing any shoulder discomfort.  · Hold __________ seconds.  · Release the tension slowly. Relax your shoulder muscles completely before you do the next repetition.  Repeat __________ times. Complete this exercise __________ times per day.   STRENGTH - Internal Rotators, Isometric   · Keep your right / left elbow at your side and bend it 90 degrees.  · Step into a door frame so that the inside of your right / left wrist can press against the door frame without your upper arm  leaving your side.  · Gently press your right / left wrist into the door frame as if you were trying to draw the palm of your hand to your abdomen. Gradually increase the tension until you are pressing as hard as you can without shrugging your shoulder or increasing any shoulder discomfort.  · Hold __________ seconds.  · Release the tension slowly. Relax your shoulder muscles completely before you do the next repetition.  Repeat __________ times. Complete this exercise __________ times per day.   STRENGTH - External Rotators, Isometric   · Keep your right / left elbow at your side and bend it 90 degrees.  · Step into a door frame so that the outside of your right / left wrist can press against the door frame without your upper arm leaving your side.  · Gently press your right / left wrist into the door frame as if you were trying to swing the back of your hand away from your abdomen. Gradually increase the tension until you are pressing as hard as you can without shrugging your shoulder or increasing any shoulder discomfort.  · Hold __________ seconds.  · Release the tension slowly. Relax your shoulder muscles completely before you do the next repetition.  Repeat __________ times. Complete this exercise __________ times per day.   Document Released: 02/21/2005 Document Revised: 05/16/2011 Document Reviewed: 06/05/2008  [employer]® Patient Information ©2014 [employer].

## 2012-08-14 LAB — COMPREHENSIVE METABOLIC PANEL
ALT: 14 U/L (ref 0–35)
Albumin: 4 g/dL (ref 3.5–5.2)
CO2: 25 mEq/L (ref 19–32)
Glucose, Bld: 80 mg/dL (ref 70–99)
Potassium: 4.2 mEq/L (ref 3.5–5.3)
Sodium: 141 mEq/L (ref 135–145)
Total Protein: 6.9 g/dL (ref 6.0–8.3)

## 2012-08-14 LAB — LIPID PANEL
Cholesterol: 153 mg/dL (ref 0–200)
LDL Cholesterol: 87 mg/dL (ref 0–99)
Triglycerides: 132 mg/dL (ref <150)

## 2012-08-17 ENCOUNTER — Encounter: Payer: Self-pay | Admitting: Family Medicine

## 2012-08-19 LAB — FERRITIN: Ferritin: 8 ng/mL — ABNORMAL LOW (ref 10–291)

## 2012-08-19 LAB — VITAMIN B12: Vitamin B-12: 353 pg/mL (ref 211–911)

## 2012-08-20 LAB — IRON AND TIBC
%SAT: 12 % — ABNORMAL LOW (ref 20–55)
Iron: 50 ug/dL (ref 42–145)

## 2012-08-27 ENCOUNTER — Other Ambulatory Visit: Payer: Self-pay | Admitting: Physician Assistant

## 2012-08-31 ENCOUNTER — Ambulatory Visit (INDEPENDENT_AMBULATORY_CARE_PROVIDER_SITE_OTHER): Payer: BC Managed Care – PPO | Admitting: Cardiology

## 2012-08-31 ENCOUNTER — Encounter: Payer: Self-pay | Admitting: Cardiology

## 2012-08-31 VITALS — BP 142/90 | HR 58 | Ht 59.5 in | Wt 156.2 lb

## 2012-08-31 DIAGNOSIS — I10 Essential (primary) hypertension: Secondary | ICD-10-CM

## 2012-08-31 DIAGNOSIS — I4949 Other premature depolarization: Secondary | ICD-10-CM

## 2012-08-31 DIAGNOSIS — R0789 Other chest pain: Secondary | ICD-10-CM

## 2012-08-31 DIAGNOSIS — I493 Ventricular premature depolarization: Secondary | ICD-10-CM

## 2012-08-31 MED ORDER — LOSARTAN POTASSIUM 100 MG PO TABS: 100.0000 mg | ORAL_TABLET | Freq: Every day | ORAL | Status: DC

## 2012-08-31 NOTE — Patient Instructions (Signed)
Increase Cozaar to 100 mg daily.  Continue your other therapy  I will see you in one year.

## 2012-08-31 NOTE — Progress Notes (Signed)
Tammy [name] Date of Birth: [birth year] Medical Record #[id number]  History of Present Illness: Tammy [name] is seen for followup today. She has a history of PVCs and hypertension. She also has a history of atypical chest pain. She had a normal stress Myoview in 2009. In October 2012 she had a cardiac catheterization which was normal. Recently she has been under increased stress. Her blood pressure has been high and she has not felt well. Lisinopril was discontinued because of cough. She is not a good candidate for beta blockers because of a low resting heart rate. She was started on Cozaar one month ago but has continued to have elevated blood pressure readings. She is also using Lasix as needed for swelling. She is following a low-sodium diet.  Current Outpatient Prescriptions on File Prior to Visit  Medication Sig Dispense Refill  . albuterol (PROVENTIL HFA;VENTOLIN HFA) 108 (90 BASE) MCG/ACT inhaler Inhale 2 puffs into the lungs every 6 (six) hours as needed.      Marland Kitchen albuterol (PROVENTIL) (2.5 MG/3ML) 0.083% nebulizer solution Take 2.5 mg by nebulization every 6 (six) hours as needed.      Marland Kitchen aspirin 81 MG tablet Take 81 mg by mouth daily.      . benzonatate (TESSALON) 100 MG capsule Take 1-2 capsules (100-200 mg total) by mouth 3 (three) times daily as needed for cough.  60 capsule  1  . furosemide (LASIX) 20 MG tablet Take 1 tablet (20 mg total) by mouth daily.  30 tablet  3  . lovastatin (MEVACOR) 40 MG tablet Take 1 tablet (40 mg total) by mouth at bedtime.  30 tablet  5  . metaxalone (SKELAXIN) 800 MG tablet Take 1 tablet (800 mg total) by mouth 3 (three) times daily.  60 tablet  0  . metoprolol tartrate (LOPRESSOR) 25 MG tablet Take 1 tablet (25 mg total) by mouth 2 (two) times daily.  60 tablet  3  . Multiple Vitamin (MULTIVITAMIN) tablet Take 1 tablet by mouth daily.      . OXYGEN-HELIUM IN Inhale 2 L/min into the lungs at bedtime.      . pantoprazole (PROTONIX) 40 MG tablet Take 1 tablet (40  mg total) by mouth 2 (two) times daily.  60 tablet  11  . traMADol (ULTRAM) 50 MG tablet Take 1 tablet (50 mg total) by mouth every 6 (six) hours as needed for pain (cough).  30 tablet  1  . traZODone (DESYREL) 50 MG tablet Take 1 tablet (50 mg total) by mouth at bedtime as needed for sleep.  30 tablet  5  . zolpidem (AMBIEN) 5 MG tablet Take 1 tablet (5 mg total) by mouth at bedtime as needed for sleep.  30 tablet  5   No current facility-administered medications on file prior to visit.    Allergies  Allergen Reactions  . Septra Ds (Sulfamethoxazole-Tmp Ds) Nausea Only    Past Medical History  Diagnosis Date  . Gastric ulcer, unspecified as acute or chronic, without mention of hemorrhage, perforation, or obstruction   . Dysplasia of cervix, unspecified   . Personal history of colonic polyps   . Irritable bowel syndrome   . Essential hypertension, benign   . Carpal tunnel syndrome   . Insomnia, unspecified   . Esophageal reflux   . Diaphragmatic hernia without mention of obstruction or gangrene   . Pure hypercholesterolemia   . Allergic rhinitis, cause unspecified   . Mixed incontinence urge and stress (female)(female)   . Chronic  interstitial cystitis   . Methicillin resistant Staphylococcus aureus in conditions classified elsewhere and of unspecified site   . Hiatal hernia   . Colon polyps 03/07/2004    Past Surgical History  Procedure Laterality Date  . Tonsillectomy and adenoidectomy    . Carpal tunnel release    . Abdominal hysterectomy  03/07/2000    CERVICAL DYSPLASIA; OVARIES INTACT  . Colonoscopy  03/07/2004    colon polyps single.  [place].    History  Smoking status  . Never Smoker   Smokeless tobacco  . Not on file    History  Alcohol Use  . Yes    Comment: occasional once per month on average. Twisted Tea.    Family History  Problem Relation Age of Onset  . Hyperlipidemia Mother   . Hypertension Mother   . Hypertension Father   . Coronary artery  disease Father     stents  . Heart disease Father 87    stenting  . Hyperlipidemia Father     Review of Systems: As noted in history of present illness.  All other systems were reviewed and are negative.  Physical Exam: BP 142/90  Pulse 58  Ht 4' 11.5" (1.511 m)  Wt 156 lb 3.2 oz (70.852 kg)  BMI 31.03 kg/m2 She is a pleasant, overweight white female in no acute distress. HEENT: Normal.  no JVD or bruits. Lungs: Clear Cardiovascular: Regular rate and rhythm, normal S1 and S2, no gallop or murmur. Abdomen: Soft and nontender. No masses or bruits. Bowel sounds are positive. Extremities: No cyanosis or edema. Pulses are 2+ and symmetric. Skin: Warm and dry Neuro: Alert and oriented x3. Cranial nerves II through XII are intact.  LABORATORY DATA: Lab Results  Component Value Date   WBC 5.7 08/13/2012   HGB 11.8* 08/13/2012   HCT 35.7* 08/13/2012   PLT 274 08/13/2012   GLUCOSE 80 08/13/2012   CHOL 153 08/13/2012   TRIG 132 08/13/2012   HDL 40 08/13/2012   LDLCALC 87 08/13/2012   ALT 14 08/13/2012   AST 17 08/13/2012   NA 141 08/13/2012   K 4.2 08/13/2012   CL 106 08/13/2012   CREATININE 0.59 08/13/2012   BUN 14 08/13/2012   CO2 25 08/13/2012   TSH 1.739 11/14/2011   INR 0.89 12/12/2010   ECG today demonstrates sinus bradycardia with a rate of 59 beats per minute. There is very minor nonspecific ST abnormality.  Assessment / Plan: 1. Hypertension-control is not optimal. I recommended increasing Cozaar to 100 mg daily. Continue low-sodium diet. Hopefully once some of her stressors resolve her blood pressure will improve.  2. Atypical chest pain. Normal cardiac catheterization October 2012.  3. History of PVCs. Minimally symptomatic.

## 2012-09-17 ENCOUNTER — Telehealth: Payer: Self-pay | Admitting: Family Medicine

## 2012-09-17 MED ORDER — HYDROCHLOROTHIAZIDE 25 MG PO TABS: 12.5000 mg | ORAL_TABLET | Freq: Every day | ORAL | Status: DC

## 2012-09-17 NOTE — Telephone Encounter (Signed)
Presented to office with father-in-law and requested BP being checked.  BP in office 195/100.  Some headaches; some dizziness; some fatigue.  Continue Losartan 100mg  daily; call in HCTZ 25mg  1/2 to 1 daily.

## 2012-10-01 DIAGNOSIS — N393 Stress incontinence (female) (male): Secondary | ICD-10-CM | POA: Insufficient documentation

## 2012-10-31 ENCOUNTER — Telehealth: Payer: Self-pay

## 2012-10-31 MED ORDER — ZOLPIDEM TARTRATE 5 MG PO TABS
5.0000 mg | ORAL_TABLET | Freq: Every evening | ORAL | Status: DC | PRN
Start: 2012-10-31 — End: 2013-05-27

## 2012-10-31 NOTE — Telephone Encounter (Signed)
Call --- Zolpidem 5mg  RF sent to [facility] on [address].

## 2012-10-31 NOTE — Telephone Encounter (Signed)
Called in prescription for [facility] for Ambien 5 mg to [facility] on [address].

## 2012-10-31 NOTE — Telephone Encounter (Signed)
Pharm reqs RF of zolpidem 5 mg.

## 2012-11-02 IMAGING — CR DG CHEST 2 VIEW
2 series · 2 of 2 positions shown · non-contrast
Comparison: None.

CLINICAL DATA: Left-sided chest pain

CHEST - 2 VIEW

[w chest pa]
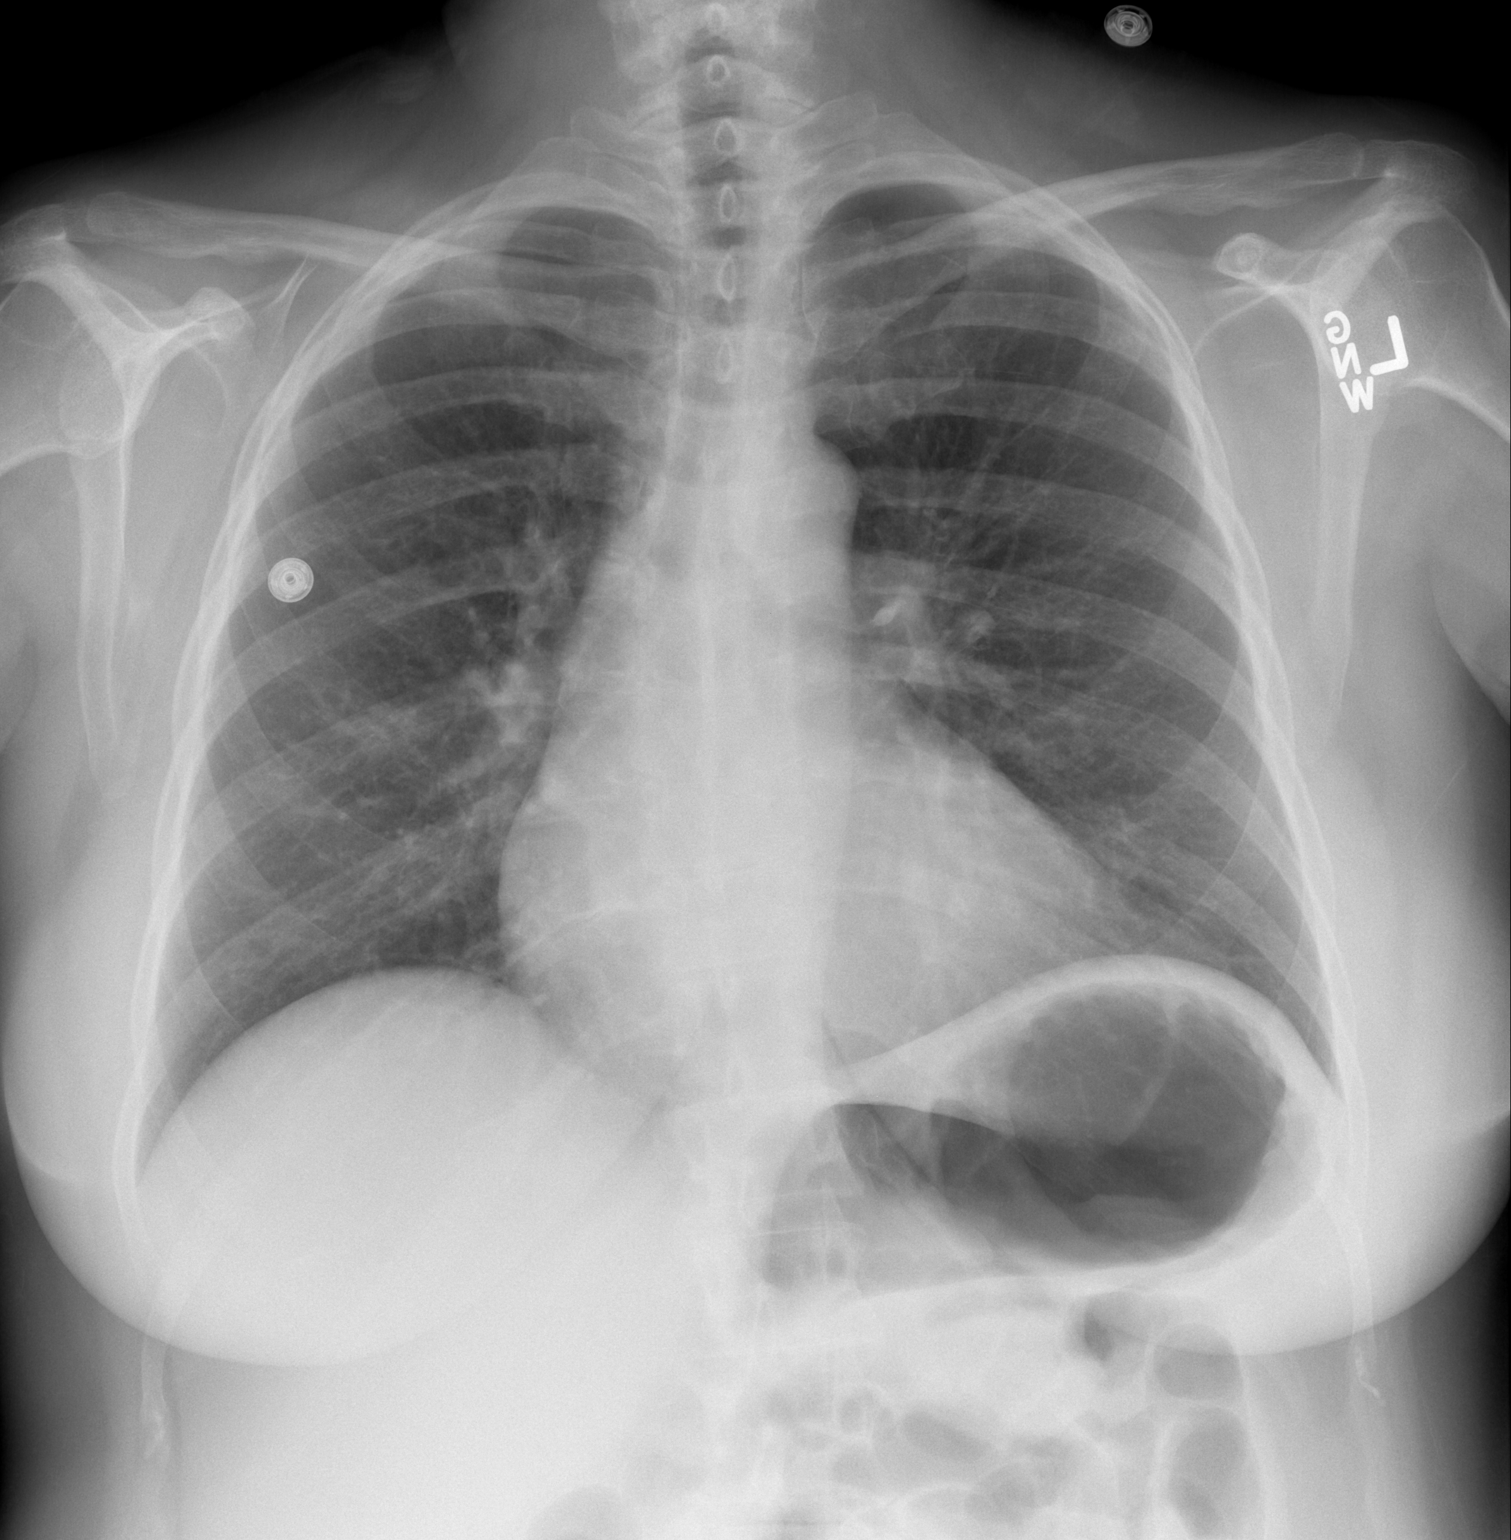

[w chest lat]
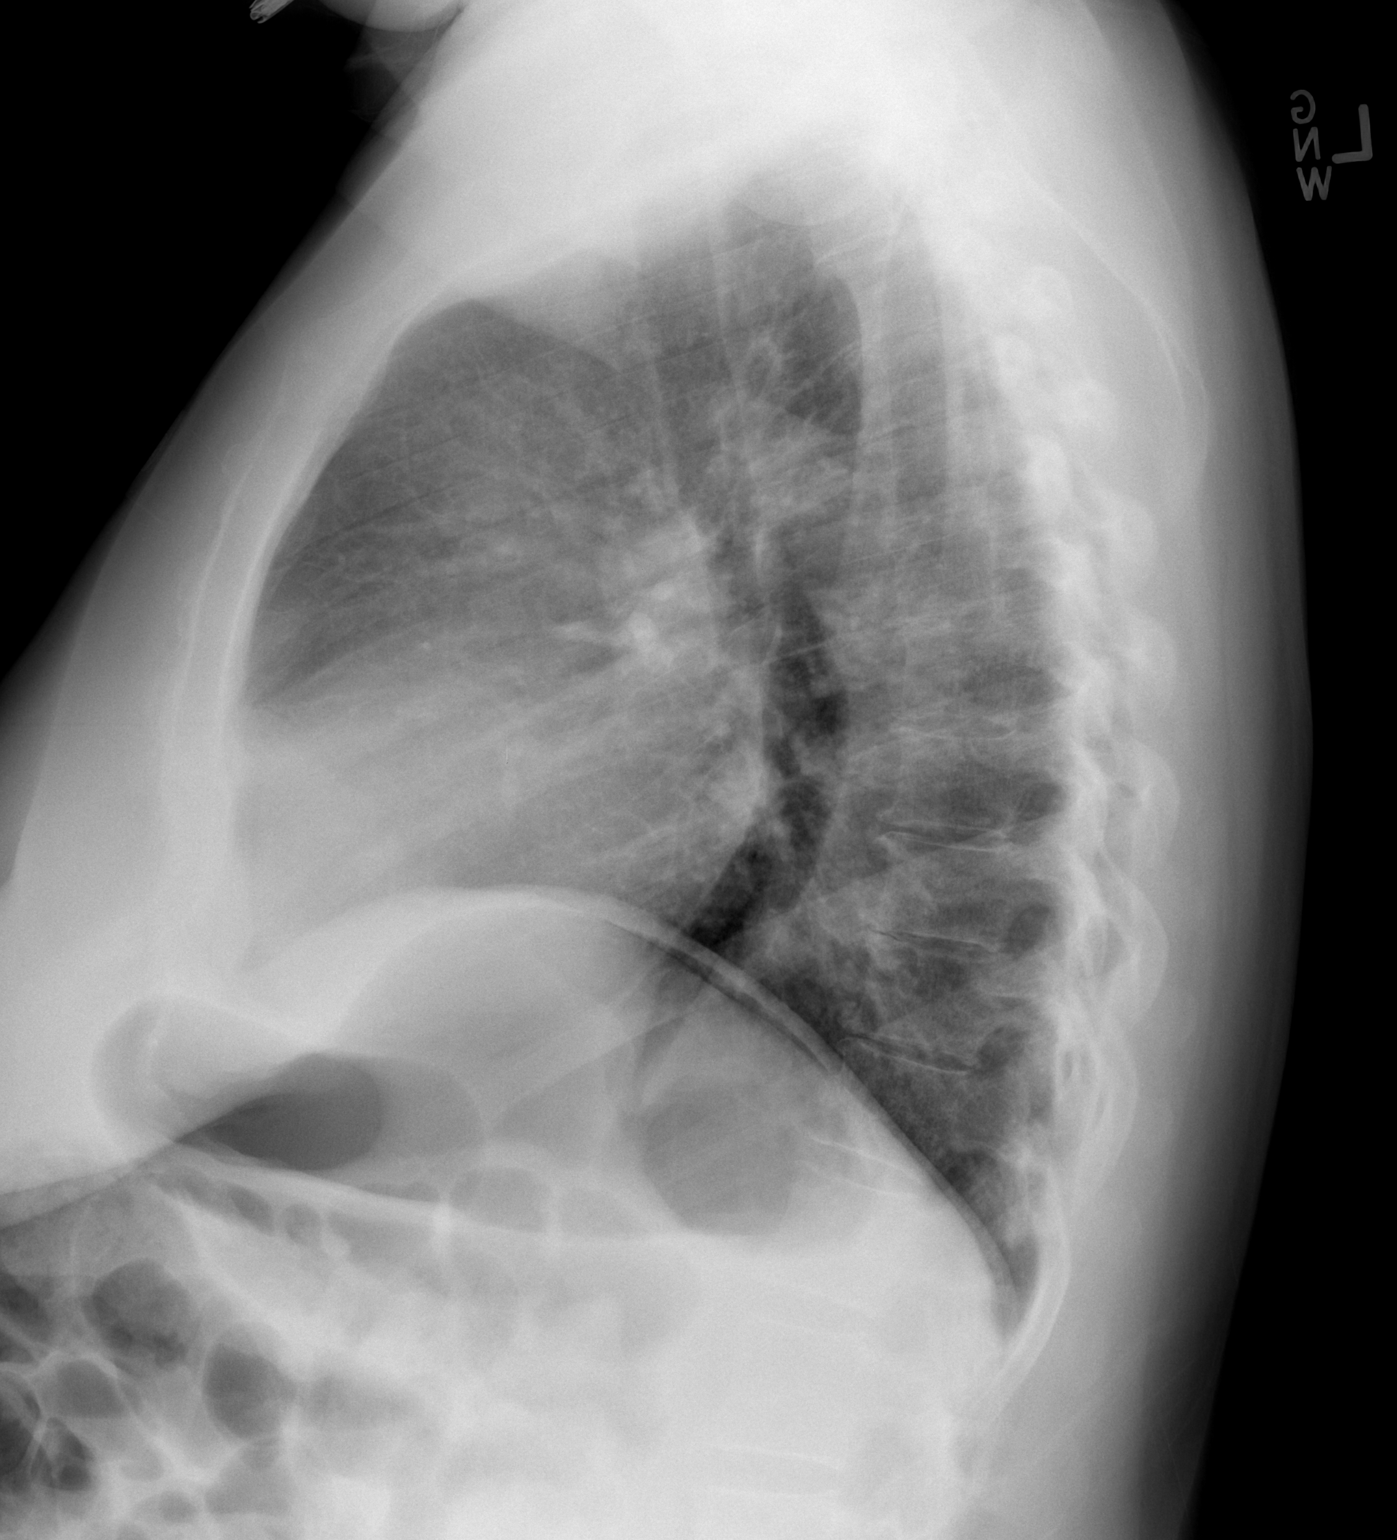

[2 of 2 positions shown; findings below may reference images not displayed]

FINDINGS: Lungs are clear. No pleural effusion or pneumothorax. The
cardiomediastinal contours are within normal limits. The visualized
bones and soft tissues are without significant appreciable
abnormality.
IMPRESSION: No acute cardiopulmonary process.

## 2012-11-22 ENCOUNTER — Other Ambulatory Visit: Payer: Self-pay

## 2012-11-22 DIAGNOSIS — E7800 Pure hypercholesterolemia, unspecified: Secondary | ICD-10-CM

## 2012-11-22 MED ORDER — LOVASTATIN 40 MG PO TABS: 40.0000 mg | ORAL_TABLET | Freq: Every day | ORAL | Status: DC

## 2012-11-26 ENCOUNTER — Encounter: Payer: BC Managed Care – PPO | Admitting: Family Medicine

## 2012-11-28 ENCOUNTER — Ambulatory Visit (INDEPENDENT_AMBULATORY_CARE_PROVIDER_SITE_OTHER): Payer: BC Managed Care – PPO | Admitting: Family Medicine

## 2012-11-28 ENCOUNTER — Encounter: Payer: Self-pay | Admitting: Family Medicine

## 2012-11-28 VITALS — BP 138/94 | HR 51 | Temp 97.7°F | Resp 16 | Ht 59.0 in | Wt 153.8 lb

## 2012-11-28 DIAGNOSIS — Z23 Encounter for immunization: Secondary | ICD-10-CM

## 2012-11-28 DIAGNOSIS — Z0000 Encounter for general adult medical examination without abnormal findings: Secondary | ICD-10-CM

## 2012-11-28 DIAGNOSIS — Z01419 Encounter for gynecological examination (general) (routine) without abnormal findings: Secondary | ICD-10-CM

## 2012-11-28 LAB — POCT URINALYSIS DIPSTICK
Bilirubin, UA: NEGATIVE
Ketones, UA: NEGATIVE
Protein, UA: NEGATIVE
Spec Grav, UA: 1.015
pH, UA: 8.5

## 2012-11-28 LAB — LIPID PANEL
Cholesterol: 204 mg/dL — ABNORMAL HIGH (ref 0–200)
LDL Cholesterol: 98 mg/dL (ref 0–99)
Total CHOL/HDL Ratio: 4.4 Ratio
Triglycerides: 299 mg/dL — ABNORMAL HIGH (ref <150)
VLDL: 60 mg/dL — ABNORMAL HIGH (ref 0–40)

## 2012-11-28 LAB — COMPREHENSIVE METABOLIC PANEL
AST: 20 U/L (ref 0–37)
Alkaline Phosphatase: 76 U/L (ref 39–117)
Glucose, Bld: 89 mg/dL (ref 70–99)
Potassium: 4.1 mEq/L (ref 3.5–5.3)
Sodium: 139 mEq/L (ref 135–145)
Total Bilirubin: 0.3 mg/dL (ref 0.3–1.2)
Total Protein: 7.3 g/dL (ref 6.0–8.3)

## 2012-11-28 LAB — HEMOGLOBIN A1C: Hgb A1c MFr Bld: 6 % — ABNORMAL HIGH (ref <5.7)

## 2012-11-28 LAB — IFOBT (OCCULT BLOOD): IFOBT: NEGATIVE

## 2012-11-28 LAB — CBC
Hemoglobin: 12.8 g/dL (ref 12.0–15.0)
MCH: 29.6 pg (ref 26.0–34.0)
MCHC: 33.9 g/dL (ref 30.0–36.0)
RDW: 15 % (ref 11.5–15.5)

## 2012-11-28 NOTE — Progress Notes (Signed)
[address]   [place], [place]  [address]   [phone]  Subjective:    Patient ID: Tammy [name], female    DOB: 09-21-1955, 57 y.o.   MRN: [id number]  HPI This 57 y.o. female presents for evaluation for CPE.  Last physical 11/2011. Pap smear 11/2011.  S/p hysterectomy for cervical dysplasia. Ovaries intact. Mammogram 12/2011. Colonoscopy 2006. Repeat 5 years.  Knows that she is overdue for repeat colonoscopy; plans to schedule. TDAP 08/13/12. Pneumovax 11/2011. Flu vaccine today. Dental exam 2012. Eye exam 2013; +glasses.   Review of Systems  Constitutional: Positive for fatigue. Negative for fever, chills, diaphoresis, activity change, appetite change and unexpected weight change.  HENT: Negative for congestion, ear pain, facial swelling, hearing loss, postnasal drip, rhinorrhea, sinus pressure and sore throat.   Eyes: Negative for visual disturbance.  Respiratory: Negative for apnea, cough, shortness of breath, wheezing and stridor.   Cardiovascular: Positive for leg swelling. Negative for chest pain and palpitations.  Gastrointestinal: Negative for nausea, vomiting, abdominal pain, diarrhea, constipation, blood in stool, abdominal distention, anal bleeding and rectal pain.  Endocrine: Negative for cold intolerance, heat intolerance, polydipsia, polyphagia and polyuria.  Genitourinary: Negative for dysuria, urgency, frequency, hematuria, flank pain, vaginal bleeding, vaginal discharge, genital sores, vaginal pain and pelvic pain.  Musculoskeletal: Positive for arthralgias, back pain and myalgias. Negative for gait problem and joint swelling.  Skin: Negative for color change, pallor, rash and wound.  Neurological: Negative for dizziness, tremors, seizures, syncope, facial asymmetry, speech difficulty, weakness, light-headedness, numbness and headaches.  Hematological: Negative for adenopathy. Does not bruise/bleed easily.  Psychiatric/Behavioral: Positive for sleep disturbance.  Negative for suicidal ideas, self-injury, dysphoric mood and decreased concentration. The patient is not nervous/anxious.    Past Medical History  Diagnosis Date  . Gastric ulcer, unspecified as acute or chronic, without mention of hemorrhage, perforation, or obstruction   . Dysplasia of cervix, unspecified   . Personal history of colonic polyps   . Irritable bowel syndrome   . Essential hypertension, benign   . Carpal tunnel syndrome   . Insomnia, unspecified   . Esophageal reflux   . Diaphragmatic hernia without mention of obstruction or gangrene   . Pure hypercholesterolemia   . Allergic rhinitis, cause unspecified   . Mixed incontinence urge and stress (female)(female)   . Chronic interstitial cystitis   . Methicillin resistant Staphylococcus aureus in conditions classified elsewhere and of unspecified site   . Hiatal hernia   . Colon polyps 03/07/2004   Past Surgical History  Procedure Laterality Date  . Tonsillectomy and adenoidectomy    . Carpal tunnel release    . Abdominal hysterectomy  03/07/2000    CERVICAL DYSPLASIA; OVARIES INTACT  . Colonoscopy  03/07/2004    colon polyps single.  [facility].   Allergies  Allergen Reactions  . Septra Ds [Sulfamethoxazole-Tmp Ds] Nausea Only   Current Outpatient Prescriptions on File Prior to Visit  Medication Sig Dispense Refill  . albuterol (PROVENTIL HFA;VENTOLIN HFA) 108 (90 BASE) MCG/ACT inhaler Inhale 2 puffs into the lungs every 6 (six) hours as needed.      Marland Kitchen albuterol (PROVENTIL) (2.5 MG/3ML) 0.083% nebulizer solution Take 2.5 mg by nebulization every 6 (six) hours as needed.      Marland Kitchen aspirin 81 MG tablet Take 81 mg by mouth daily.      . benzonatate (TESSALON) 100 MG capsule Take 1-2 capsules (100-200 mg total) by mouth 3 (three) times daily as needed for cough.  60 capsule  1  . furosemide (LASIX) 20 MG tablet Take 1 tablet (20 mg total) by mouth daily.  30 tablet  3  . hydrochlorothiazide (HYDRODIURIL) 25 MG tablet Take 0.5-1  tablets (12.5-25 mg total) by mouth daily.  30 tablet  5  . losartan (COZAAR) 100 MG tablet Take 1 tablet (100 mg total) by mouth daily.  90 tablet  3  . lovastatin (MEVACOR) 40 MG tablet Take 1 tablet (40 mg total) by mouth at bedtime.  90 tablet  0  . metoprolol tartrate (LOPRESSOR) 25 MG tablet Take 1 tablet (25 mg total) by mouth 2 (two) times daily.  60 tablet  3  . Multiple Vitamin (MULTIVITAMIN) tablet Take 1 tablet by mouth daily.      . pantoprazole (PROTONIX) 40 MG tablet Take 1 tablet (40 mg total) by mouth 2 (two) times daily.  60 tablet  11  . traZODone (DESYREL) 50 MG tablet Take 1 tablet (50 mg total) by mouth at bedtime as needed for sleep.  30 tablet  5  . zolpidem (AMBIEN) 5 MG tablet Take 1 tablet (5 mg total) by mouth at bedtime as needed for sleep.  30 tablet  5   No current facility-administered medications on file prior to visit.   History   Social History  . Marital Status: Married    Spouse Name: N/A    Number of Children: 2  . Years of Education: N/A   Occupational History  . 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 Smoking status: Never Smoker   . Smokeless tobacco: Not on file  . Alcohol Use: Yes     Comment: occasional once per month on average. Twisted Tea.  . Drug Use: Not on file  . Sexual Activity: Not on file   Other Topics Concern  . Not on file   Social History Narrative   Exercise: 3-5 days per week walking with husband.    Married x 36 years; happy; no abuse. Husband on long term disability. Pt's daughter and grandchild also live in the home.        2 children,1 stepchild and 5 grandchildren.        No tobacco history;       +drinks twice monthly;       no drugs.        Exercise:  sporadically   Family History  Problem Relation Age of Onset  . Hyperlipidemia Mother   . Hypertension Mother   . Hypertension Father   . Coronary artery disease Father     stents  .  Heart disease Father 12    stenting  . Hyperlipidemia Father        Objective:   Physical Exam  Nursing note and vitals reviewed. Constitutional: She is oriented to person, place, and time. She appears well-developed and well-nourished. No distress.  HENT:  Head: Normocephalic and atraumatic.  Right Ear: External ear normal.  Left Ear: External ear normal.  Nose: Nose normal.  Mouth/Throat: Oropharynx is clear and moist.  Eyes: Conjunctivae are normal. Pupils are equal, round, and reactive to light.  Neck: Normal range of motion. Neck supple. No JVD present. No thyromegaly present.  Cardiovascular: Normal rate, regular rhythm, normal heart sounds and intact distal pulses.  Exam reveals no gallop and no friction rub.   No murmur heard. Pulmonary/Chest: Effort normal and breath sounds normal. She has no wheezes. She has no rales.  Abdominal: Soft. Bowel sounds are normal. She exhibits no distension and no mass. There is no tenderness. There is no rebound and no guarding.  Genitourinary: Rectum normal and vagina normal. No breast swelling, tenderness, discharge or bleeding. There is no rash, tenderness or lesion on the right labia. There is no rash, tenderness or lesion on the left labia. Right adnexum displays no mass, no tenderness and no fullness. Left adnexum displays no mass, no tenderness and no fullness.  Musculoskeletal:       Right shoulder: Normal.       Left shoulder: Normal.       Cervical back: Normal.       Lumbar back: Normal.  Lymphadenopathy:    She has no cervical adenopathy.  Neurological: She is alert and oriented to person, place, and time. No cranial nerve deficit. She exhibits normal muscle tone. Coordination normal.  Skin: Skin is warm and dry. No rash noted. She is not diaphoretic. No erythema. No pallor.  Psychiatric: She has a normal mood and affect. Her behavior is normal. Judgment and thought content normal.      INFLUENZA VACCINE ADMINISTERED IN  OFFICE.  Assessment & Plan:  Routine general medical examination at a health care facility - Plan: CBC, Comprehensive metabolic panel, TSH, Lipid panel, Vitamin B12, Hemoglobin A1c, Vit D  25 hydroxy (rtn osteoporosis monitoring), POCT urinalysis dipstick, EKG 12-Lead, IFOBT POC (occult bld, rslt in office)  Need for prophylactic vaccination and inoculation against influenza - Plan: Flu Vaccine QUAD 36+ mos IM  Routine gynecological examination - Plan: MM Digital Screening, Pap IG (Image Guided)  1. CPE: anticipatory guidance --- weight loss, exercise. Pap smear obtained due to history of cervical dysplasia.  Refer for mammogram.  Overdue for colonoscopy; Hemosure obtained.  S/p flu vaccine in office; all other immunizations UTD.  Obtain labs. 2.  Gynecological exam: performed; pap smear obtained due to history of cervical dysplasia; refer for mammogram. 3.  S/p flu vaccine.  Meds ordered this encounter  Medications  . ferrous sulfate 325 (65 FE) MG tablet    Sig: Take 325 mg by mouth daily with breakfast.   Nilda Simmer, M.D.  [facility] [address] [place], [place]  [address] [phone] phone [phone] fax

## 2012-11-29 LAB — PAP IG (IMAGE GUIDED)

## 2012-11-30 ENCOUNTER — Encounter: Payer: Self-pay | Admitting: Family Medicine

## 2012-12-03 ENCOUNTER — Ambulatory Visit: Payer: Self-pay | Admitting: Internal Medicine

## 2012-12-04 ENCOUNTER — Other Ambulatory Visit: Payer: Self-pay | Admitting: Family Medicine

## 2012-12-05 ENCOUNTER — Telehealth: Payer: Self-pay

## 2012-12-05 MED ORDER — METAXALONE 800 MG PO TABS: 800.0000 mg | ORAL_TABLET | Freq: Three times a day (TID) | ORAL | Status: DC

## 2012-12-05 NOTE — Telephone Encounter (Signed)
Pharm requests RFs of metaxalone 800 mg TID. Dr Katrinka Blazing, I see that you saw pt recently but don't see that this medication was discussed. Do you want to give pt any RFs?

## 2012-12-05 NOTE — Telephone Encounter (Signed)
Yes, refill sent to pharmacy

## 2012-12-28 ENCOUNTER — Ambulatory Visit: Payer: Self-pay

## 2013-03-25 ENCOUNTER — Encounter: Payer: Self-pay | Admitting: Family Medicine

## 2013-03-26 ENCOUNTER — Other Ambulatory Visit: Payer: Self-pay | Admitting: Family Medicine

## 2013-04-19 ENCOUNTER — Other Ambulatory Visit: Payer: Self-pay | Admitting: Family Medicine

## 2013-05-27 ENCOUNTER — Other Ambulatory Visit: Payer: Self-pay

## 2013-05-27 NOTE — Telephone Encounter (Signed)
Pharm reqs RFs of zolpidem. Pt due for f/up, but has appt on 08/14/13. Pended RFs until then, but you may want pt to RTC before appt.

## 2013-05-28 MED ORDER — ZOLPIDEM TARTRATE 5 MG PO TABS
5.0000 mg | ORAL_TABLET | Freq: Every evening | ORAL | Status: DC | PRN
Start: ? — End: 2013-12-18

## 2013-05-28 NOTE — Telephone Encounter (Signed)
Called in.

## 2013-05-28 NOTE — Telephone Encounter (Signed)
Please call in refill for Ambien as approved. 

## 2013-06-27 ENCOUNTER — Other Ambulatory Visit: Payer: Self-pay | Admitting: Family Medicine

## 2013-06-27 NOTE — Telephone Encounter (Signed)
Dr Katrinka BlazingSmith, we had given pt notice on last Rf that she needed OV for more. She has appt sch for 08/14/13. Can we RF until then?

## 2013-06-29 NOTE — Telephone Encounter (Signed)
Refill approved.

## 2013-07-05 ENCOUNTER — Other Ambulatory Visit: Payer: Self-pay | Admitting: Cardiology

## 2013-07-30 ENCOUNTER — Ambulatory Visit: Payer: Self-pay | Admitting: Internal Medicine

## 2013-07-30 ENCOUNTER — Telehealth: Payer: Self-pay | Admitting: Internal Medicine

## 2013-07-30 DIAGNOSIS — R911 Solitary pulmonary nodule: Secondary | ICD-10-CM

## 2013-07-30 NOTE — Telephone Encounter (Signed)
Pt scheduled for appt with [facility] 08/30/13.  Requesting to have CT chest completed before this appt.  Order has been placed for 1 year CT Chest w/o contrast before this upcoming.   Pt aware that someone will be contacting her in regards to scheduling this CT.   Nothing further needed.

## 2013-07-31 ENCOUNTER — Other Ambulatory Visit: Payer: Self-pay | Admitting: Cardiology

## 2013-08-02 ENCOUNTER — Ambulatory Visit
Admission: RE | Admit: 2013-08-02 | Discharge: 2013-08-02 | Disposition: A | Discharge status: Home / Self Care | Payer: BC Managed Care – PPO | Source: Ambulatory Visit | Attending: Family Medicine | Admitting: Family Medicine

## 2013-08-02 DIAGNOSIS — Z01419 Encounter for gynecological examination (general) (routine) without abnormal findings: Secondary | ICD-10-CM

## 2013-08-03 ENCOUNTER — Other Ambulatory Visit: Payer: Self-pay | Admitting: Cardiology

## 2013-08-05 ENCOUNTER — Other Ambulatory Visit: Payer: Self-pay | Admitting: Family Medicine

## 2013-08-05 ENCOUNTER — Encounter: Payer: Self-pay | Admitting: Family Medicine

## 2013-08-05 DIAGNOSIS — R928 Other abnormal and inconclusive findings on diagnostic imaging of breast: Secondary | ICD-10-CM

## 2013-08-07 ENCOUNTER — Encounter: Payer: Self-pay | Admitting: Family Medicine

## 2013-08-14 ENCOUNTER — Ambulatory Visit
Admission: RE | Admit: 2013-08-14 | Discharge: 2013-08-14 | Disposition: A | Discharge status: Home / Self Care | Payer: BC Managed Care – PPO | Source: Ambulatory Visit | Attending: Family Medicine | Admitting: Family Medicine

## 2013-08-14 ENCOUNTER — Ambulatory Visit: Payer: BC Managed Care – PPO | Admitting: Family Medicine

## 2013-08-14 ENCOUNTER — Other Ambulatory Visit: Payer: Self-pay | Admitting: Family Medicine

## 2013-08-14 DIAGNOSIS — R928 Other abnormal and inconclusive findings on diagnostic imaging of breast: Secondary | ICD-10-CM

## 2013-08-17 ENCOUNTER — Other Ambulatory Visit: Payer: Self-pay | Admitting: Family Medicine

## 2013-08-23 ENCOUNTER — Ambulatory Visit
Admission: RE | Admit: 2013-08-23 | Discharge: 2013-08-23 | Disposition: A | Discharge status: Home / Self Care | Payer: BC Managed Care – PPO | Source: Ambulatory Visit | Attending: Family Medicine | Admitting: Family Medicine

## 2013-08-23 ENCOUNTER — Other Ambulatory Visit: Payer: Self-pay | Admitting: Family Medicine

## 2013-08-23 DIAGNOSIS — R928 Other abnormal and inconclusive findings on diagnostic imaging of breast: Secondary | ICD-10-CM

## 2013-08-26 ENCOUNTER — Ambulatory Visit: Payer: BC Managed Care – PPO | Admitting: Family Medicine

## 2013-08-26 ENCOUNTER — Encounter: Payer: Self-pay | Admitting: Family Medicine

## 2013-08-27 ENCOUNTER — Other Ambulatory Visit: Payer: Self-pay | Admitting: Cardiology

## 2013-08-27 ENCOUNTER — Other Ambulatory Visit: Payer: Self-pay | Admitting: Family Medicine

## 2013-08-29 ENCOUNTER — Other Ambulatory Visit: Payer: Self-pay

## 2013-08-30 ENCOUNTER — Ambulatory Visit: Payer: Self-pay | Admitting: Internal Medicine

## 2013-09-02 ENCOUNTER — Other Ambulatory Visit: Payer: BC Managed Care – PPO

## 2013-09-10 ENCOUNTER — Other Ambulatory Visit: Payer: Self-pay | Admitting: Family Medicine

## 2013-09-10 ENCOUNTER — Ambulatory Visit (INDEPENDENT_AMBULATORY_CARE_PROVIDER_SITE_OTHER)
Admission: RE | Admit: 2013-09-10 | Discharge: 2013-09-10 | Disposition: A | Discharge status: Home / Self Care | Payer: BC Managed Care – PPO | Source: Ambulatory Visit | Attending: Internal Medicine | Admitting: Internal Medicine

## 2013-09-10 DIAGNOSIS — R911 Solitary pulmonary nodule: Secondary | ICD-10-CM

## 2013-09-17 ENCOUNTER — Other Ambulatory Visit: Payer: Self-pay | Admitting: Cardiology

## 2013-09-18 ENCOUNTER — Other Ambulatory Visit: Payer: Self-pay | Admitting: Family Medicine

## 2013-09-18 ENCOUNTER — Encounter: Payer: Self-pay | Admitting: Internal Medicine

## 2013-09-27 ENCOUNTER — Ambulatory Visit: Payer: BC Managed Care – PPO | Admitting: Internal Medicine

## 2013-10-02 ENCOUNTER — Telehealth: Payer: Self-pay

## 2013-10-02 NOTE — Telephone Encounter (Signed)
Pt called in and is worried because her feet and legs are swollen and painful. Her husband is in the hospital and she has not been getting enough rest and has taken her lasix but still swollen. She states that her BP is normal and heart rate is fine but is concerned about the swelling. She is not sure what else to do, She has started to elevate her feet on her husbands bed. She can be reached on her cell @[phone]. Thank you

## 2013-10-03 ENCOUNTER — Encounter: Payer: Self-pay | Admitting: Family Medicine

## 2013-10-03 ENCOUNTER — Other Ambulatory Visit: Payer: Self-pay | Admitting: *Deleted

## 2013-10-03 MED ORDER — POTASSIUM CHLORIDE CRYS ER 20 MEQ PO TBCR: 20.0000 meq | EXTENDED_RELEASE_TABLET | Freq: Every day | ORAL | Status: DC

## 2013-10-03 NOTE — Telephone Encounter (Signed)
lmom for pt to cb

## 2013-10-03 NOTE — Telephone Encounter (Signed)
lmom for pt to cb. Explained briefly explaind that she has not been seen here for quite some time and would need to RTC for evaluation.

## 2013-10-03 NOTE — Telephone Encounter (Signed)
spoke to pt, i explained to her that since she has not been seen for this issue, she would need an OV for an evaluation. She then began to explain that her husband is in intensive care, She did not leave this call for anyone. It was supposed to be directed to Dr. Katrinka BlazingSmith, she is a patient, her husband is a patient, her father is a patient, she just saw dr Katrinka Blazingsmith out, she knows her and her entire family, she was her patient before she went to [place], followed her to [place], and then followed her back to our office. She does not want a generic answer from anyone other than dr Katrinka Blazingsmith. She understands our protocol, and she knows Dr Katrinka BlazingSmith is the medical director, but dr Katrinka Blazingsmith knows her and wants this message forwarded to her. She was expecting a call from dr Katrinka Blazingsmith.  i explained that our calls don't go directly to any provider, we have a protocol in place in an effort to free up our provider to see patients; the assistants handle everything they can and if not we then send the message to the provider. She stated that's why she called and left a message for dr Katrinka Blazingsmith, she knew we couldn't handle it. She requested this message be forwarded to Dr. Katrinka BlazingSmith, i explained this would not be a problem.  Please advise

## 2013-10-03 NOTE — Telephone Encounter (Signed)
Return patient's call ---- 1.  If she is taking one Lasix per day for swelling currently, recommend increasing to 2 Lasix daily as needed for swelling. Would take one at 8:00am or upon awakening and then take the second one six hours later.  If she does take two Lasix per day, then she will also need to be taking a potassium supplement; does she have any potassium tablets?  2. Swelling is from prolonged standing at hospital and likely high salt diet (from eating out more I would assume).  3. If swelling improves, then decrease Lasix to one daily.  4. Of course, she will need to be evaluated if she develops chest pain, shortness of breath.

## 2013-10-03 NOTE — Telephone Encounter (Signed)
She would like a [facility] for the potassium [facility] phone [phone], fax: [phone], [email or web address]  She did want to thank you for helping with her husband.

## 2013-10-03 NOTE — Telephone Encounter (Signed)
Spoke to Tammy [name]. She understands the instructions with the Lasix. She is wondering about the potassium. Is this over the counter, if so what is the dosage? Is it prescription?

## 2013-10-05 IMAGING — CR CHEST
2 series · 2 of 2 positions shown · non-contrast
Comparison: None.

CLINICAL DATA: Cough.

CHEST - 2 VIEW

[lateral]
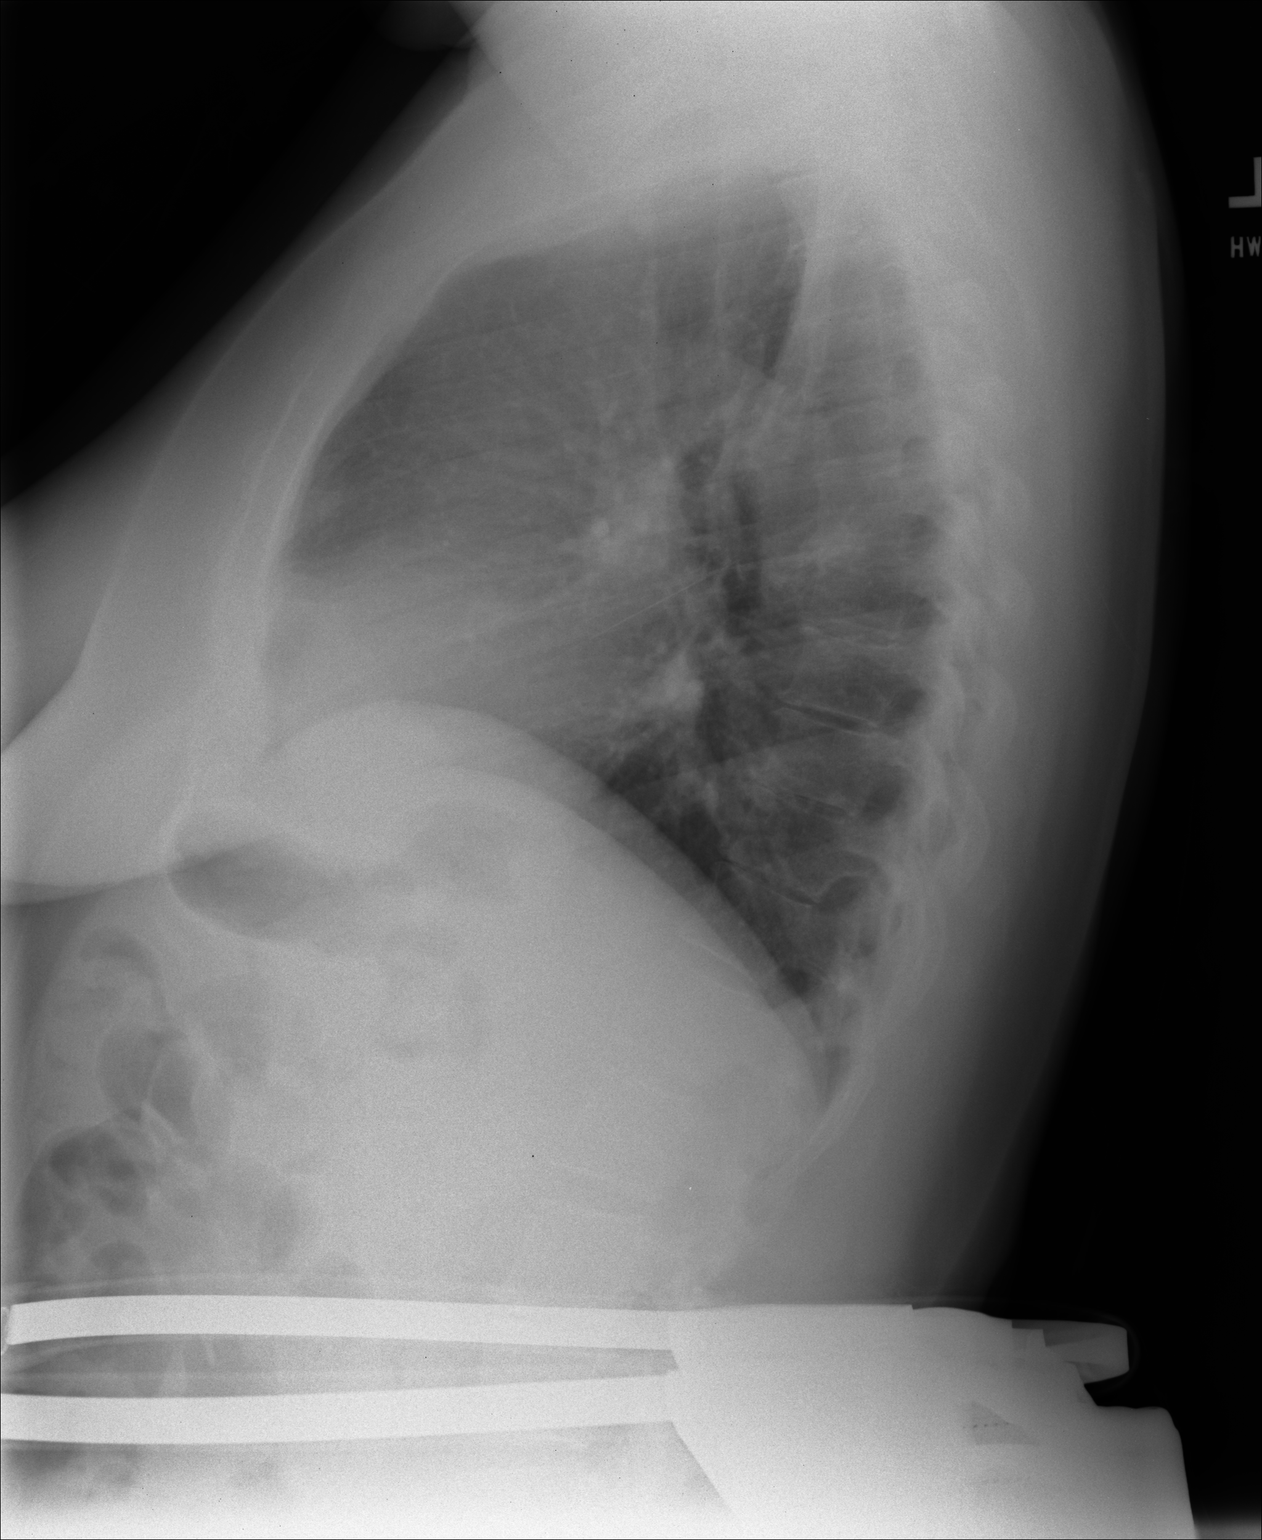

[PA]
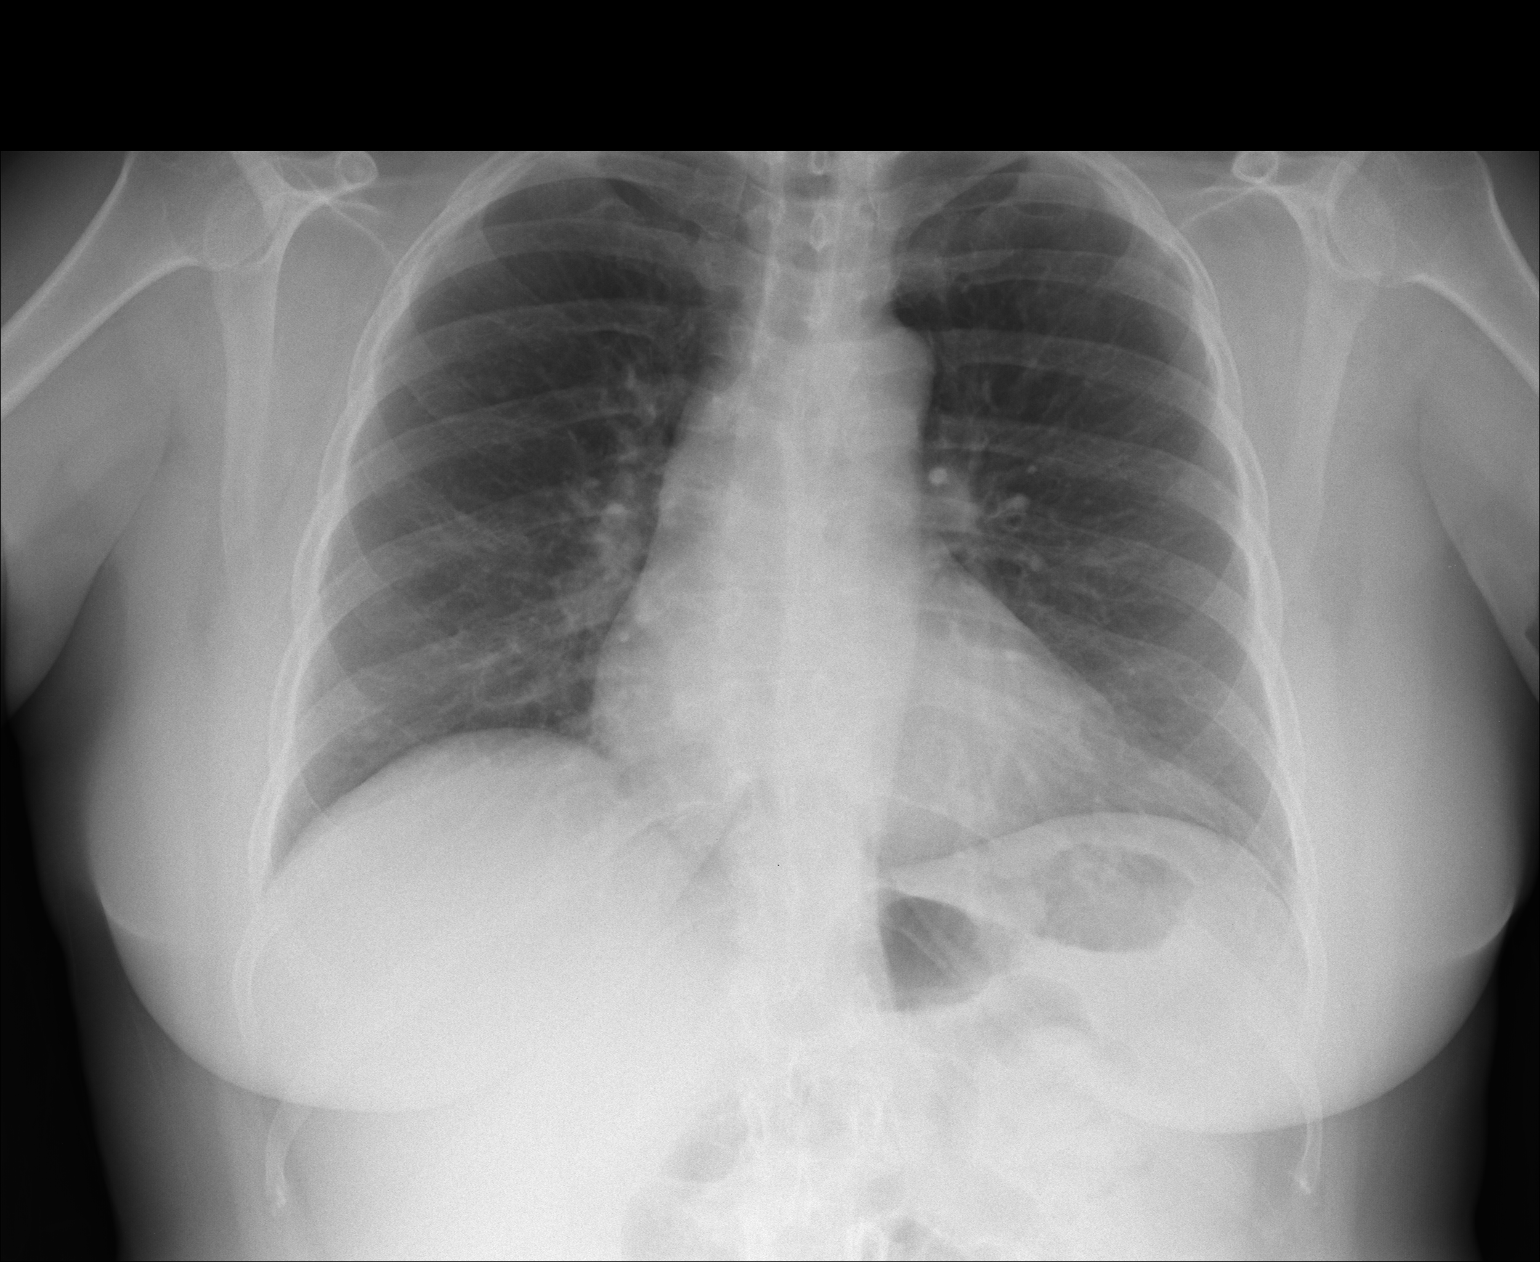

[2 of 2 positions shown; findings below may reference images not displayed]

FINDINGS: The lungs are clear without focal infiltrate, edema,
pneumothorax or pleural effusion.  1 x 2 cm nodular density
projects over the upper thoracic spine on the lateral film.
Cardiopericardial silhouette is at upper limits of normal for size.
Imaged bony structures of the thorax are intact.
IMPRESSION: 1 x 2 cm oval opacity overlies the upper thoracic spine on the
lateral projection.  This may be related to spinal osteophytes, but
a posterior parenchymal nodule cannot be excluded.  CT chest
without contrast recommended to further evaluate.

Clinically significant discrepancy from primary report, if
provided: Nodular density overlying the thoracic spine on the
lateral projection.

## 2013-10-19 ENCOUNTER — Other Ambulatory Visit: Payer: Self-pay | Admitting: Cardiology

## 2013-10-21 ENCOUNTER — Other Ambulatory Visit: Payer: Self-pay | Admitting: Cardiology

## 2013-10-29 ENCOUNTER — Other Ambulatory Visit: Payer: Self-pay | Admitting: Family Medicine

## 2013-10-29 ENCOUNTER — Other Ambulatory Visit: Payer: Self-pay | Admitting: Cardiology

## 2013-10-30 NOTE — Telephone Encounter (Signed)
Dr Katrinka Blazing, pt is very overdue for f/up, but has appt 12/18/13. OK to RF through then? Pended x 2 mos.

## 2013-11-15 ENCOUNTER — Other Ambulatory Visit: Payer: Self-pay | Admitting: Family Medicine

## 2013-11-19 ENCOUNTER — Encounter: Payer: Self-pay | Admitting: Cardiology

## 2013-11-25 ENCOUNTER — Other Ambulatory Visit: Payer: Self-pay | Admitting: Cardiology

## 2013-11-30 ENCOUNTER — Other Ambulatory Visit: Payer: Self-pay | Admitting: Cardiology

## 2013-11-30 ENCOUNTER — Other Ambulatory Visit: Payer: Self-pay | Admitting: Family Medicine

## 2013-12-06 ENCOUNTER — Other Ambulatory Visit: Payer: Self-pay | Admitting: Family Medicine

## 2013-12-11 ENCOUNTER — Encounter: Payer: Self-pay | Admitting: Internal Medicine

## 2013-12-18 ENCOUNTER — Encounter: Payer: Self-pay | Admitting: Family Medicine

## 2013-12-18 ENCOUNTER — Ambulatory Visit (INDEPENDENT_AMBULATORY_CARE_PROVIDER_SITE_OTHER): Payer: BC Managed Care – PPO | Admitting: Family Medicine

## 2013-12-18 VITALS — BP 110/78 | HR 73 | Temp 98.3°F | Resp 16 | Ht 59.5 in | Wt 161.8 lb

## 2013-12-18 DIAGNOSIS — Z23 Encounter for immunization: Secondary | ICD-10-CM

## 2013-12-18 DIAGNOSIS — E785 Hyperlipidemia, unspecified: Secondary | ICD-10-CM

## 2013-12-18 DIAGNOSIS — K219 Gastro-esophageal reflux disease without esophagitis: Secondary | ICD-10-CM

## 2013-12-18 DIAGNOSIS — K635 Polyp of colon: Secondary | ICD-10-CM

## 2013-12-18 DIAGNOSIS — R05 Cough: Secondary | ICD-10-CM

## 2013-12-18 DIAGNOSIS — R7302 Impaired glucose tolerance (oral): Secondary | ICD-10-CM

## 2013-12-18 DIAGNOSIS — I10 Essential (primary) hypertension: Secondary | ICD-10-CM

## 2013-12-18 DIAGNOSIS — G4700 Insomnia, unspecified: Secondary | ICD-10-CM

## 2013-12-18 DIAGNOSIS — Z0000 Encounter for general adult medical examination without abnormal findings: Secondary | ICD-10-CM

## 2013-12-18 DIAGNOSIS — L409 Psoriasis, unspecified: Secondary | ICD-10-CM

## 2013-12-18 DIAGNOSIS — I493 Ventricular premature depolarization: Secondary | ICD-10-CM

## 2013-12-18 DIAGNOSIS — Z209 Contact with and (suspected) exposure to unspecified communicable disease: Secondary | ICD-10-CM

## 2013-12-18 DIAGNOSIS — N879 Dysplasia of cervix uteri, unspecified: Secondary | ICD-10-CM

## 2013-12-18 DIAGNOSIS — R059 Cough, unspecified: Secondary | ICD-10-CM

## 2013-12-18 LAB — CBC WITH DIFFERENTIAL/PLATELET
BASOS ABS: 0 10*3/uL (ref 0.0–0.1)
BASOS PCT: 0 % (ref 0–1)
EOS ABS: 0.2 10*3/uL (ref 0.0–0.7)
EOS PCT: 4 % (ref 0–5)
HEMATOCRIT: 38.3 % (ref 36.0–46.0)
Hemoglobin: 13 g/dL (ref 12.0–15.0)
Lymphocytes Relative: 43 % (ref 12–46)
Lymphs Abs: 2.2 10*3/uL (ref 0.7–4.0)
MCH: 29.8 pg (ref 26.0–34.0)
MCHC: 33.9 g/dL (ref 30.0–36.0)
MCV: 87.8 fL (ref 78.0–100.0)
MONO ABS: 0.5 10*3/uL (ref 0.1–1.0)
Monocytes Relative: 9 % (ref 3–12)
Neutro Abs: 2.2 10*3/uL (ref 1.7–7.7)
Neutrophils Relative %: 44 % (ref 43–77)
Platelets: 291 10*3/uL (ref 150–400)
RBC: 4.36 MIL/uL (ref 3.87–5.11)
RDW: 13.7 % (ref 11.5–15.5)
WBC: 5 10*3/uL (ref 4.0–10.5)

## 2013-12-18 LAB — COMPREHENSIVE METABOLIC PANEL
ALK PHOS: 62 U/L (ref 39–117)
ALT: 34 U/L (ref 0–35)
AST: 26 U/L (ref 0–37)
Albumin: 4.3 g/dL (ref 3.5–5.2)
BILIRUBIN TOTAL: 0.8 mg/dL (ref 0.2–1.2)
BUN: 17 mg/dL (ref 6–23)
CO2: 28 mEq/L (ref 19–32)
Calcium: 9.6 mg/dL (ref 8.4–10.5)
Chloride: 103 mEq/L (ref 96–112)
Creat: 0.78 mg/dL (ref 0.50–1.10)
GLUCOSE: 98 mg/dL (ref 70–99)
Potassium: 3.8 mEq/L (ref 3.5–5.3)
Sodium: 140 mEq/L (ref 135–145)
Total Protein: 7.1 g/dL (ref 6.0–8.3)

## 2013-12-18 LAB — POCT URINALYSIS DIPSTICK
Bilirubin, UA: NEGATIVE
Glucose, UA: NEGATIVE
KETONES UA: NEGATIVE
Nitrite, UA: NEGATIVE
PH UA: 6
PROTEIN UA: 30
Spec Grav, UA: 1.02
Urobilinogen, UA: 0.2

## 2013-12-18 LAB — HEPATITIS C ANTIBODY: HCV Ab: NEGATIVE

## 2013-12-18 LAB — IFOBT (OCCULT BLOOD): IMMUNOLOGICAL FECAL OCCULT BLOOD TEST: NEGATIVE

## 2013-12-18 LAB — HEMOGLOBIN A1C
Hgb A1c MFr Bld: 5.8 % — ABNORMAL HIGH (ref <5.7)
Mean Plasma Glucose: 120 mg/dL — ABNORMAL HIGH (ref <117)

## 2013-12-18 LAB — LIPID PANEL
CHOL/HDL RATIO: 4.4 ratio
Cholesterol: 174 mg/dL (ref 0–200)
HDL: 40 mg/dL (ref >39)
LDL Cholesterol: 90 mg/dL (ref 0–99)
TRIGLYCERIDES: 222 mg/dL — AB (ref <150)
VLDL: 44 mg/dL — AB (ref 0–40)

## 2013-12-18 LAB — TSH: TSH: 1.307 u[IU]/mL (ref 0.350–4.500)

## 2013-12-18 MED ORDER — LOSARTAN POTASSIUM 100 MG PO TABS: 100.0000 mg | ORAL_TABLET | Freq: Every day | ORAL | Status: DC

## 2013-12-18 MED ORDER — ACETAMINOPHEN-CODEINE 300-30 MG PO TABS: 1.0000 | ORAL_TABLET | Freq: Four times a day (QID) | ORAL | Status: DC | PRN

## 2013-12-18 MED ORDER — ESZOPICLONE 3 MG PO TABS: 3.0000 mg | ORAL_TABLET | Freq: Every day | ORAL | Status: DC

## 2013-12-18 MED ORDER — FUROSEMIDE 20 MG PO TABS: 20.0000 mg | ORAL_TABLET | Freq: Every day | ORAL | Status: DC

## 2013-12-18 MED ORDER — LOVASTATIN 40 MG PO TABS: 40.0000 mg | ORAL_TABLET | Freq: Every day | ORAL | Status: DC

## 2013-12-18 MED ORDER — METOPROLOL TARTRATE 25 MG PO TABS: 25.0000 mg | ORAL_TABLET | Freq: Two times a day (BID) | ORAL | Status: DC

## 2013-12-18 MED ORDER — BENZONATATE 100 MG PO CAPS: 100.0000 mg | ORAL_CAPSULE | Freq: Three times a day (TID) | ORAL | Status: DC | PRN

## 2013-12-18 MED ORDER — POTASSIUM CHLORIDE CRYS ER 20 MEQ PO TBCR: 20.0000 meq | EXTENDED_RELEASE_TABLET | Freq: Every day | ORAL | Status: DC

## 2013-12-18 MED ORDER — PANTOPRAZOLE SODIUM 40 MG PO TBEC: 40.0000 mg | DELAYED_RELEASE_TABLET | Freq: Two times a day (BID) | ORAL | Status: DC

## 2013-12-18 MED ORDER — HYDROCHLOROTHIAZIDE 25 MG PO TABS: 25.0000 mg | ORAL_TABLET | Freq: Every day | ORAL | Status: DC

## 2013-12-18 MED ORDER — HYDROCORTISONE 2.5 % EX OINT: TOPICAL_OINTMENT | Freq: Two times a day (BID) | CUTANEOUS | Status: DC

## 2013-12-18 MED ORDER — ZOLPIDEM TARTRATE 5 MG PO TABS: 5.0000 mg | ORAL_TABLET | Freq: Every evening | ORAL | Status: DC | PRN

## 2013-12-18 NOTE — Progress Notes (Signed)
Patient ID: Tammy [name], female   DOB: November 11, 1955, 58 y.o.   MRN: [id number]   Subjective:  This chart was scribed for Tammy Chick, MD by Tammy [name], ED Scribe. The patient was seen in room 21. Patient's care was started at 9:12 AM.    Patient ID: Tammy [name], female    DOB: Sep 22, 1955, 58 y.o.   MRN: [id number]  12/18/2013  Annual Exam and Medication Refill  HPI HPI Comments: Tammy [name] is a 58 y.o. female who presents to the [facility] for annual exam. Last CPE was 11/2012. Last pap 11/2012; normal. Last mammogram 08/23/2013; status post biopsy of L breast; negative. Last colonoscopy 2006; repeat was recommended in 5 years; realized last year she was overdue for colonoscopy. Pt has not had another colonoscopy done. Pt reports IBS symptoms and plans to schedule another appointment. Tdap 2014. Pneumovax 2013. Flu vaccine yearly. Last eye exam 2013; no glaucoma or cataracts. Last dental appointment 2014. No surgeries in the past year other than breast biopsy.   P't's mother is 33 y.o, father is 12 y.o, sister is 23 y.o, brother is 79 y.o; she denies new health problems with any of her family members. Pt has been married for 39 years. She has 2 children, 1 stepchild and 5 grandchildren. Her youngest daughter and granddaughter live with her. Pt was a social smoker in her teenage years. She reports alcohol consumption x1 monthly. Pt states that she is not currently exercising but expresses her plan to return to working out.   Chest Pain Pt reports intermittent chest pain that occurs at random. She states that she has experienced chest pain while sitting and watching tv and while up, moving around. She describes the pain as a sharp, deep sensation. She reports cough and recent exacerbation of GERD that she attributes to stress and chest pain. She denies SOB or numbness in extremities. Pt reports snoring but has not had a sleep study done; she has refused sleep  study.  Constipation  Pt reports intermittent episodes of constipation and diarrhea. She denies frequent abdominal pain, blood in stools, nausea, vomiting. Pt has tried Miralax and eating yogurt daily with relief.   Rash Pt presents with a rash to bilateral arms, elbows and knees. She attributes rash to psoriasis. Pt has not seen a dermatologist in years; last dermatologist moved.  Pt plans to go to [facility] this weekend with her family; her first trip since 2012.   Review of Systems  Constitutional: Negative for fever, chills, diaphoresis, activity change, appetite change, fatigue and unexpected weight change.  HENT: Negative for congestion, dental problem, drooling, ear discharge, ear pain, facial swelling, hearing loss, mouth sores, nosebleeds, postnasal drip, rhinorrhea, sinus pressure, sneezing, sore throat, tinnitus, trouble swallowing and voice change.   Eyes: Negative for photophobia, pain, discharge, redness, itching and visual disturbance.  Respiratory: Positive for cough. Negative for apnea, choking, chest tightness, shortness of breath, wheezing and stridor.   Cardiovascular: Positive for chest pain (intermittent). Negative for palpitations and leg swelling.  Gastrointestinal: Positive for diarrhea (intermittent) and constipation (intermittent). Negative for nausea, vomiting, abdominal pain, blood in stool, abdominal distention, anal bleeding and rectal pain.  Endocrine: Negative for cold intolerance, heat intolerance, polydipsia, polyphagia and polyuria.  Genitourinary: Negative for dysuria, urgency, frequency, hematuria, flank pain, decreased urine volume, vaginal bleeding, vaginal discharge, enuresis, difficulty urinating, genital sores, vaginal pain, menstrual problem, pelvic pain and dyspareunia.  Musculoskeletal: Negative for arthralgias, back pain, gait problem, joint  swelling, myalgias, neck pain and neck stiffness.  Skin: Positive for rash. Negative for color change, pallor  and wound.  Allergic/Immunologic: Negative for environmental allergies, food allergies and immunocompromised state.  Neurological: Negative for dizziness, tremors, seizures, syncope, facial asymmetry, speech difficulty, weakness, light-headedness, numbness and headaches.  Hematological: Negative for adenopathy. Does not bruise/bleed easily.  Psychiatric/Behavioral: Positive for sleep disturbance and dysphoric mood. Negative for suicidal ideas, hallucinations, behavioral problems, confusion, self-injury, decreased concentration and agitation. The patient is nervous/anxious. The patient is not hyperactive.    Past Medical History  Diagnosis Date  . Gastric ulcer, unspecified as acute or chronic, without mention of hemorrhage, perforation, or obstruction   . Dysplasia of cervix, unspecified   . Personal history of colonic polyps   . Irritable bowel syndrome   . Essential hypertension, benign   . Carpal tunnel syndrome   . Insomnia, unspecified   . Esophageal reflux   . Diaphragmatic hernia without mention of obstruction or gangrene   . Pure hypercholesterolemia   . Allergic rhinitis, cause unspecified   . Mixed incontinence urge and stress (female)(female)   . Chronic interstitial cystitis   . Methicillin resistant Staphylococcus aureus in conditions classified elsewhere and of unspecified site   . Hiatal hernia   . Colon polyps 03/07/2004   Past Surgical History  Procedure Laterality Date  . Tonsillectomy and adenoidectomy    . Carpal tunnel release    . Abdominal hysterectomy  03/07/2000    CERVICAL DYSPLASIA; OVARIES INTACT  . Colonoscopy  03/07/2004    colon polyps single.  [place].   Allergies  Allergen Reactions  . Septra Ds [Sulfamethoxazole-Tmp Ds] Nausea Only   Current Outpatient Prescriptions  Medication Sig Dispense Refill  . Acetaminophen-Codeine 300-30 MG per tablet Take 1 tablet by mouth every 6 (six) hours as needed for pain.  30 tablet  0  . albuterol (PROVENTIL  HFA;VENTOLIN HFA) 108 (90 BASE) MCG/ACT inhaler Inhale 2 puffs into the lungs every 6 (six) hours as needed.      Marland Kitchen albuterol (PROVENTIL) (2.5 MG/3ML) 0.083% nebulizer solution Take 2.5 mg by nebulization every 6 (six) hours as needed.      Marland Kitchen aspirin 81 MG tablet Take 81 mg by mouth daily.      . benzonatate (TESSALON) 100 MG capsule Take 1-2 capsules (100-200 mg total) by mouth 3 (three) times daily as needed for cough.  60 capsule  1  . Eszopiclone 3 MG TABS Take 1 tablet (3 mg total) by mouth at bedtime. Take immediately before bedtime  30 tablet  5  . ferrous sulfate 325 (65 FE) MG tablet Take 325 mg by mouth daily with breakfast.      . furosemide (LASIX) 20 MG tablet Take 1 tablet (20 mg total) by mouth daily.  30 tablet  5  . hydrochlorothiazide (HYDRODIURIL) 25 MG tablet Take 1 tablet (25 mg total) by mouth daily.  30 tablet  11  . hydrocortisone 2.5 % ointment Apply topically 2 (two) times daily.  30 g  1  . losartan (COZAAR) 100 MG tablet Take 1 tablet (100 mg total) by mouth daily.  30 tablet  11  . lovastatin (MEVACOR) 40 MG tablet Take 1 tablet (40 mg total) by mouth at bedtime.  30 tablet  11  . metoprolol tartrate (LOPRESSOR) 25 MG tablet Take 1 tablet (25 mg total) by mouth 2 (two) times daily.  60 tablet  5  . Multiple Vitamin (MULTIVITAMIN) tablet Take 1 tablet by mouth daily.      Marland Kitchen  OVER THE COUNTER MEDICATION Melantonin taking daily      . pantoprazole (PROTONIX) 40 MG tablet Take 1 tablet (40 mg total) by mouth 2 (two) times daily.  60 tablet  11  . potassium chloride SA (K-DUR,KLOR-CON) 20 MEQ tablet Take 1 tablet (20 mEq total) by mouth daily.  30 tablet  11  . traZODone (DESYREL) 50 MG tablet Take 1 tablet (50 mg total) by mouth at bedtime as needed for sleep.  30 tablet  5  . zolpidem (AMBIEN) 5 MG tablet Take 1 tablet (5 mg total) by mouth at bedtime as needed for sleep.  30 tablet  5   No current facility-administered medications for this visit.      Objective:     Triage Vitals: BP 110/78  Pulse 73  Temp(Src) 98.3 F (36.8 C) (Oral)  Resp 16  Ht 4' 11.5" (1.511 m)  Wt 161 lb 12.8 oz (73.392 kg)  BMI 32.15 kg/m2  SpO2 97% Physical Exam  Nursing note and vitals reviewed. Constitutional: She is oriented to person, place, and time. She appears well-developed and well-nourished. No distress.  HENT:  Head: Normocephalic and atraumatic.  Right Ear: Tympanic membrane and external ear normal.  Left Ear: Tympanic membrane and external ear normal.  Nose: Nose normal.  Mouth/Throat: Oropharynx is clear and moist.  Eyes: Conjunctivae and EOM are normal. Pupils are equal, round, and reactive to light.  Neck: Normal range of motion and full passive range of motion without pain. Neck supple. No JVD present. Carotid bruit is not present. No thyromegaly present.  Cardiovascular: Normal rate, regular rhythm and normal heart sounds.  Exam reveals no gallop and no friction rub.   No murmur heard. Pulmonary/Chest: Effort normal and breath sounds normal. She has no wheezes. She has no rales. Right breast exhibits no inverted nipple, no mass, no nipple discharge, no skin change and no tenderness. Left breast exhibits no inverted nipple, no mass, no nipple discharge, no skin change and no tenderness. Breasts are symmetrical.  Abdominal: Soft. Bowel sounds are normal. She exhibits no distension and no mass. There is no tenderness. There is no rebound and no guarding.  Genitourinary: Rectum normal and vagina normal. There is no rash, tenderness, lesion or injury on the right labia. There is no rash, tenderness, lesion or injury on the left labia. Right adnexum displays no mass, no tenderness and no fullness. Left adnexum displays no mass, no tenderness and no fullness.  Ovarian exam normal  Musculoskeletal: Normal range of motion.       Right shoulder: Normal.       Left shoulder: Normal.       Cervical back: Normal.  Lymphadenopathy:    She has no cervical adenopathy.   Neurological: She is alert and oriented to person, place, and time. She has normal reflexes. No cranial nerve deficit. She exhibits normal muscle tone. Coordination normal.  Skin: Skin is warm and dry. Rash noted. She is not diaphoretic. No erythema. No pallor.  Scaling rash on extensor surface of bilateral elbows that extends to surface of bilateral knees  Psychiatric: She has a normal mood and affect. Her behavior is normal. Judgment and thought content normal.   Results for orders placed in visit on 12/18/13  POCT URINALYSIS DIPSTICK      Result Value Ref Range   Color, UA yellow     Clarity, UA clear     Glucose, UA neg     Bilirubin, UA neg  Ketones, UA neg     Spec Grav, UA 1.020     Blood, UA mod     pH, UA 6.0     Protein, UA 30     Urobilinogen, UA 0.2     Nitrite, UA neg     Leukocytes, UA small (1+)    IFOBT (OCCULT BLOOD)      Result Value Ref Range   IFOBT Negative        INFLUENZA VACCINE ADMINISTERED.  Assessment & Plan:   1. Essential hypertension   2. Annual physical exam   3. Glucose intolerance (impaired glucose tolerance)   4. Dyslipidemia   5. Exposure to communicable disease   6. PVCs (premature ventricular contractions)   7. Colon polyp   8. Cervical dysplasia   9. Flu vaccine need   10. Cough   11. Gastroesophageal reflux disease without esophagitis   12. Psoriasis   13. Insomnia     1. Complete Physical Examination: anticipatory guidance --- restart ASA 81 mg daily; recommend weight loss and exercise. Colonoscopy overdue; hemosure obtained in office.  Immunizations UTD; s/p flu vaccine.   2.  Gynecological exam: pap smear obtained due to history of cervical dysplasia.  Mammogram UTD.  Breast and pelvic examinations completed; s/p hysterectomy. 3.  Glucose Intolerance: stable; obtain labs. 4.  Hyperlipidemia: controlled; obtain labs; refill provided. 5.  HTN: controlled; obtain labs; refills provided.  Obtain EKG. 6.  PVCs: stable;  continue Metoprolol PRN. 7.  Colon polyps: stable; overdue for colonoscopy; pt plans to schedule; hemosure obtained. 8.  GERD: uncontrolled; refill of Protonix provided; recommend increasing Protonix to bid for one month. 9.  Cough/URI:  New. Rx for [employer] Perles and Tylenol #3 provided. 10.  Psoriasis: uncontrolled; refer to dermatology; rx for Hydrocortisone ointment 2.5% bid provided. 11.  Insomnia: moderately controlled with Ambien and Melatonin.  Rx for Lunesta 3mg  provided instead of Ambien.  Continue Melatonin.  Intolerant to Trazodone. 12. S/p flu vaccine.  Meds ordered this encounter  Medications  . OVER THE COUNTER MEDICATION    Sig: Melantonin taking daily  . benzonatate (TESSALON) 100 MG capsule    Sig: Take 1-2 capsules (100-200 mg total) by mouth 3 (three) times daily as needed for cough.    Dispense:  60 capsule    Refill:  1  . furosemide (LASIX) 20 MG tablet    Sig: Take 1 tablet (20 mg total) by mouth daily.    Dispense:  30 tablet    Refill:  5  . hydrochlorothiazide (HYDRODIURIL) 25 MG tablet    Sig: Take 1 tablet (25 mg total) by mouth daily.    Dispense:  30 tablet    Refill:  11  . losartan (COZAAR) 100 MG tablet    Sig: Take 1 tablet (100 mg total) by mouth daily.    Dispense:  30 tablet    Refill:  11  . lovastatin (MEVACOR) 40 MG tablet    Sig: Take 1 tablet (40 mg total) by mouth at bedtime.    Dispense:  30 tablet    Refill:  11    CYCLE FILL MEDICATION. Authorization is required for next refill.  . metoprolol tartrate (LOPRESSOR) 25 MG tablet    Sig: Take 1 tablet (25 mg total) by mouth 2 (two) times daily.    Dispense:  60 tablet    Refill:  5    CYCLE FILL MEDICATION. Authorization is required for next refill.  . pantoprazole (PROTONIX) 40 MG tablet  Sig: Take 1 tablet (40 mg total) by mouth 2 (two) times daily.    Dispense:  60 tablet    Refill:  11    CYCLE FILL MEDICATION.  Marland Kitchen. potassium chloride SA (K-DUR,KLOR-CON) 20 MEQ tablet    Sig:  Take 1 tablet (20 mEq total) by mouth daily.    Dispense:  30 tablet    Refill:  11  . zolpidem (AMBIEN) 5 MG tablet    Sig: Take 1 tablet (5 mg total) by mouth at bedtime as needed for sleep.    Dispense:  30 tablet    Refill:  5  . Acetaminophen-Codeine 300-30 MG per tablet    Sig: Take 1 tablet by mouth every 6 (six) hours as needed for pain.    Dispense:  30 tablet    Refill:  0  . Eszopiclone 3 MG TABS    Sig: Take 1 tablet (3 mg total) by mouth at bedtime. Take immediately before bedtime    Dispense:  30 tablet    Refill:  5  . hydrocortisone 2.5 % ointment    Sig: Apply topically 2 (two) times daily.    Dispense:  30 g    Refill:  1   Return in about 6 months (around 06/19/2014) for recheck of high blood pressure, high cholesterol.  I personally performed the services described in this documentation, which was scribed in my presence. The recorded information has been reviewed and is accurate.  Nilda SimmerKristi Nolie Bignell, M.D.  [facility]  [place] [address] [place], [place]  [id number] [phone] phone [phone] fax

## 2013-12-18 NOTE — Patient Instructions (Signed)

## 2013-12-20 LAB — PAP IG (IMAGE GUIDED)

## 2013-12-23 ENCOUNTER — Encounter: Payer: Self-pay | Admitting: Family Medicine

## 2013-12-24 ENCOUNTER — Telehealth: Payer: Self-pay | Admitting: Radiology

## 2013-12-24 ENCOUNTER — Encounter: Payer: Self-pay | Admitting: Family Medicine

## 2013-12-24 DIAGNOSIS — R87610 Atypical squamous cells of undetermined significance on cytologic smear of cervix (ASC-US): Secondary | ICD-10-CM

## 2013-12-24 NOTE — Telephone Encounter (Signed)
Gave pt lab results. She used to see Dr Jackelyn KnifeMeisinger years ago for GYN, but she hasn't seen him in 10 years and she doesn't even know if he is still practicing. Please refer her where you would like her to go.

## 2013-12-24 NOTE — Telephone Encounter (Signed)
Order placed for gyn referral.  

## 2013-12-24 NOTE — Telephone Encounter (Signed)
Patient called back and said that for her OBGYN referral she would like to see Dr. Jackelyn KnifeMeisinger- she says he is still in practice- she had the fax number:

## 2013-12-25 ENCOUNTER — Ambulatory Visit: Payer: BC Managed Care – PPO | Admitting: Cardiology

## 2013-12-25 ENCOUNTER — Telehealth: Payer: Self-pay | Admitting: *Deleted

## 2013-12-25 NOTE — Telephone Encounter (Signed)
Noted  

## 2013-12-25 NOTE — Telephone Encounter (Signed)
Dr. Katrinka BlazingSmith,  Pt would like to [facility]  [phone]

## 2014-01-01 ENCOUNTER — Telehealth: Payer: Self-pay

## 2014-01-01 NOTE — Telephone Encounter (Signed)
Misty StanleyLisa from [facility] called regarding patient's labs that were sent. Wants to know if an HPV test was ordered and if we had those results as well. Please return call and advise. CB [phone] ext [phone].

## 2014-01-01 NOTE — Telephone Encounter (Signed)
HPV was not done

## 2014-01-02 NOTE — Telephone Encounter (Signed)
LM test was not completed and no results are available.

## 2014-01-02 NOTE — Telephone Encounter (Signed)
Pt called in and said her OBGYN was still waiting to hear from us concerning her HPV test. I told her a message was left on 10/29- she said she would contact then again and let them know.

## 2014-01-23 ENCOUNTER — Encounter: Payer: Self-pay | Admitting: Family Medicine

## 2014-03-10 ENCOUNTER — Telehealth: Payer: Self-pay

## 2014-03-10 ENCOUNTER — Ambulatory Visit: Payer: BC Managed Care – PPO | Admitting: Cardiology

## 2014-03-10 NOTE — Telephone Encounter (Signed)
Error

## 2014-03-11 ENCOUNTER — Telehealth: Payer: Self-pay

## 2014-03-11 NOTE — Telephone Encounter (Signed)
Pharm sent req for RF of zolpidem, but should have RFs left from 12/18/13 Rx. Called and they didn't have a record of this Rx. Called in #30 w/2RFs, the # of RFs that would have been left on the Rx.

## 2014-06-23 ENCOUNTER — Ambulatory Visit: Payer: BC Managed Care – PPO | Admitting: Family Medicine

## 2014-07-09 ENCOUNTER — Encounter: Payer: Self-pay | Admitting: Family Medicine

## 2014-07-09 ENCOUNTER — Other Ambulatory Visit: Payer: Self-pay | Admitting: Family Medicine

## 2014-07-09 ENCOUNTER — Ambulatory Visit (INDEPENDENT_AMBULATORY_CARE_PROVIDER_SITE_OTHER): Payer: BLUE CROSS/BLUE SHIELD | Admitting: Family Medicine

## 2014-07-09 VITALS — BP 132/70 | HR 68 | Temp 98.1°F | Resp 16 | Ht 59.5 in | Wt 155.0 lb

## 2014-07-09 DIAGNOSIS — R05 Cough: Secondary | ICD-10-CM

## 2014-07-09 DIAGNOSIS — F419 Anxiety disorder, unspecified: Secondary | ICD-10-CM

## 2014-07-09 DIAGNOSIS — L409 Psoriasis, unspecified: Secondary | ICD-10-CM

## 2014-07-09 DIAGNOSIS — I10 Essential (primary) hypertension: Secondary | ICD-10-CM | POA: Diagnosis not present

## 2014-07-09 DIAGNOSIS — Z87410 Personal history of cervical dysplasia: Secondary | ICD-10-CM

## 2014-07-09 DIAGNOSIS — F329 Major depressive disorder, single episode, unspecified: Secondary | ICD-10-CM

## 2014-07-09 DIAGNOSIS — G4700 Insomnia, unspecified: Secondary | ICD-10-CM | POA: Diagnosis not present

## 2014-07-09 DIAGNOSIS — E785 Hyperlipidemia, unspecified: Secondary | ICD-10-CM

## 2014-07-09 DIAGNOSIS — R87610 Atypical squamous cells of undetermined significance on cytologic smear of cervix (ASC-US): Secondary | ICD-10-CM

## 2014-07-09 DIAGNOSIS — R7302 Impaired glucose tolerance (oral): Secondary | ICD-10-CM | POA: Diagnosis not present

## 2014-07-09 DIAGNOSIS — F418 Other specified anxiety disorders: Secondary | ICD-10-CM

## 2014-07-09 DIAGNOSIS — R059 Cough, unspecified: Secondary | ICD-10-CM

## 2014-07-09 MED ORDER — BENZONATATE 100 MG PO CAPS: 100.0000 mg | ORAL_CAPSULE | Freq: Three times a day (TID) | ORAL | Status: DC | PRN

## 2014-07-09 MED ORDER — HYDROCORTISONE 2.5 % EX OINT: TOPICAL_OINTMENT | Freq: Two times a day (BID) | CUTANEOUS | Status: DC

## 2014-07-09 MED ORDER — SERTRALINE HCL 50 MG PO TABS: 50.0000 mg | ORAL_TABLET | Freq: Every day | ORAL | Status: DC

## 2014-07-09 MED ORDER — ACETAMINOPHEN-CODEINE 300-30 MG PO TABS: 1.0000 | ORAL_TABLET | Freq: Four times a day (QID) | ORAL | Status: DC | PRN

## 2014-07-09 NOTE — Progress Notes (Signed)
Subjective:    Patient ID: Tammy [name], female    DOB: Jul 08, 1955, 59 y.o.   MRN: [id number]  07/09/2014  Follow-up; Hyperlipidemia; and Hypertension   HPI This 59 y.o. female presents for six month follow-up:  1.  Cervical dysplasia:  S/p evaluation by OB/GYN.  ASCUS on pap smear.  S/p evaluation by [facility]ising[facility].  He stated that PCP should not have been doing pap smear to begin with.  Recommended follow-up in six months for repeat pap smear by GYN.  S/p laser surgery x 2 in 1980s for severe dysplasia, several years later, recurrence of severe dysplasia by Scott Boiu.  He quit practicing for a while; previously saw Dr. Joneen Caraway who delivered first child.  Started then seeing Dr. Willis Modena; recurred again and underwent hysterectomy.  2014 pap smear revealed benign cellular changes.  Does not want to see Meisinger.  RN discussed with Meisinger that should not have been followed by PCP.    2.  Psoriasis: called dermatologist who has seen previously.  Requesting refill of topical ointment.  Last dermatology never for psoriasis.  Last dermatology appointment 20 years ago.  Elbows are better; anterior knees have spread on R; L knee is better.  Itches excessively.  Did have psoriasis in head.    3.  Colon cancer screening:  Was to schedule repeat colonoscopy.   Wanted [facility] to complete colonoscopy. Self dx of IBS.  4.  Insomnia:  Switched to [place].  Does not work as well as Ambien; will take Ambien one night and then take Lunesta.  Will add 1/2 Trazodone to those pills.  Cannot relax.   Up and busy.  Feels overwhelmed due to declining health of husband.  Knows must be depressed.  Also knows suffers with anxiety.  Took some type of medication 20 years ago.  No SI/HI.  5.  Hyperlipidemia:  Patient reports good compliance with medication, good tolerance to medication, and good symptom control.  Ate chili beef soup, PB jelly sandwich at 12:00.  6. Glucose intolerance:  Due for repeat labs.  7. HTN:  Patient reports good compliance with medication, good tolerance to medication, and good symptom control.   Palpitations have become more frequent recently; taking Metoprolol three days per week right now.  Taking Lasix PRN for swelling; usually worse in summer months.   Review of Systems  Constitutional: Negative for fever, chills, diaphoresis and fatigue.  Eyes: Negative for visual disturbance.  Respiratory: Negative for cough and shortness of breath.   Cardiovascular: Positive for palpitations. Negative for chest pain and leg swelling.  Gastrointestinal: Positive for diarrhea. Negative for nausea, vomiting, abdominal pain, constipation, blood in stool, abdominal distention and rectal pain.  Endocrine: Negative for cold intolerance, heat intolerance, polydipsia, polyphagia and polyuria.  Neurological: Negative for dizziness, tremors, seizures, syncope, facial asymmetry, speech difficulty, weakness, light-headedness, numbness and headaches.  Psychiatric/Behavioral: Positive for sleep disturbance, dysphoric mood and decreased concentration. Negative for suicidal ideas and self-injury. The patient is nervous/anxious.     Past Medical History  Diagnosis Date  . Gastric ulcer, unspecified as acute or chronic, without mention of hemorrhage, perforation, or obstruction   . Dysplasia of cervix, unspecified   . Personal history of colonic polyps   . Irritable bowel syndrome   . Essential hypertension, benign   . Carpal tunnel syndrome   . Insomnia, unspecified   . Esophageal reflux   . Diaphragmatic hernia without mention of obstruction or gangrene   . Pure hypercholesterolemia   . Allergic rhinitis,  cause unspecified   . Mixed incontinence urge and stress (female)(female)   . Chronic interstitial cystitis   . Methicillin resistant Staphylococcus aureus in conditions classified elsewhere and of unspecified site   . Hiatal hernia   . Colon polyps 03/07/2004   Past Surgical History  Procedure  Laterality Date  . Tonsillectomy and adenoidectomy    . Carpal tunnel release    . Abdominal hysterectomy  03/07/2000    Myoma with abnormal uterine bleeding; benign pathology; OVARIES INTACT  . Colonoscopy  03/07/2004    colon polyps single.  [place].   Allergies  Allergen Reactions  . Septra Ds [Sulfamethoxazole-Trimethoprim] Nausea Only   Current Outpatient Prescriptions  Medication Sig Dispense Refill  . Acetaminophen-Codeine 300-30 MG per tablet Take 1 tablet by mouth every 6 (six) hours as needed for pain. 30 tablet 0  . albuterol (PROVENTIL HFA;VENTOLIN HFA) 108 (90 BASE) MCG/ACT inhaler Inhale 2 puffs into the lungs every 6 (six) hours as needed.    Marland Kitchen albuterol (PROVENTIL) (2.5 MG/3ML) 0.083% nebulizer solution Take 2.5 mg by nebulization every 6 (six) hours as needed.    Marland Kitchen aspirin 81 MG tablet Take 81 mg by mouth daily.    Marland Kitchen b complex vitamins tablet Take 1 tablet by mouth daily.    . benzonatate (TESSALON) 100 MG capsule Take 1-2 capsules (100-200 mg total) by mouth 3 (three) times daily as needed for cough. 60 capsule 1  . Eszopiclone 3 MG TABS Take 1 tablet (3 mg total) by mouth at bedtime. Take immediately before bedtime 30 tablet 5  . ferrous sulfate 325 (65 FE) MG tablet Take 325 mg by mouth daily with breakfast.    . furosemide (LASIX) 20 MG tablet Take 1 tablet (20 mg total) by mouth daily. 30 tablet 5  . hydrochlorothiazide (HYDRODIURIL) 25 MG tablet Take 1 tablet (25 mg total) by mouth daily. 30 tablet 11  . hydrocortisone 2.5 % ointment Apply topically 2 (two) times daily. 30 g 1  . losartan (COZAAR) 100 MG tablet Take 1 tablet (100 mg total) by mouth daily. 30 tablet 11  . lovastatin (MEVACOR) 40 MG tablet Take 1 tablet (40 mg total) by mouth at bedtime. 30 tablet 11  . metoprolol tartrate (LOPRESSOR) 25 MG tablet Take 1 tablet (25 mg total) by mouth 2 (two) times daily. 60 tablet 5  . Multiple Vitamin (MULTIVITAMIN) tablet Take 1 tablet by mouth daily.    . pantoprazole  (PROTONIX) 40 MG tablet Take 1 tablet (40 mg total) by mouth 2 (two) times daily. 60 tablet 11  . potassium chloride SA (K-DUR,KLOR-CON) 20 MEQ tablet Take 1 tablet (20 mEq total) by mouth daily. 30 tablet 11  . traZODone (DESYREL) 50 MG tablet Take 1 tablet (50 mg total) by mouth at bedtime as needed for sleep. 30 tablet 5  . Vitamin D, Cholecalciferol, 1000 UNITS TABS Take by mouth daily.    Marland Kitchen zolpidem (AMBIEN) 5 MG tablet Take 1 tablet (5 mg total) by mouth at bedtime as needed for sleep. 30 tablet 5  . OVER THE COUNTER MEDICATION Melantonin taking daily    . sertraline (ZOLOFT) 50 MG tablet Take 1 tablet (50 mg total) by mouth daily. 30 tablet 5   No current facility-administered medications for this visit.       Objective:    BP 132/70 mmHg  Pulse 68  Temp(Src) 98.1 F (36.7 C) (Oral)  Resp 16  Ht 4' 11.5" (1.511 m)  Wt 155 lb (70.308 kg)  BMI 30.79 kg/m2  SpO2 96% Physical Exam  Constitutional: She is oriented to person, place, and time. She appears well-developed and well-nourished. No distress.  HENT:  Head: Normocephalic and atraumatic.  Right Ear: External ear normal.  Left Ear: External ear normal.  Nose: Nose normal.  Mouth/Throat: Oropharynx is clear and moist.  Eyes: Conjunctivae and EOM are normal. Pupils are equal, round, and reactive to light.  Neck: Normal range of motion. Neck supple. Carotid bruit is not present. No thyromegaly present.  Cardiovascular: Normal rate, regular rhythm, normal heart sounds and intact distal pulses.  Exam reveals no gallop and no friction rub.   No murmur heard. Pulmonary/Chest: Effort normal and breath sounds normal. She has no wheezes. She has no rales.  Abdominal: Soft. Bowel sounds are normal. She exhibits no distension and no mass. There is no tenderness. There is no rebound and no guarding.  Lymphadenopathy:    She has no cervical adenopathy.  Neurological: She is alert and oriented to person, place, and time. No cranial  nerve deficit. She exhibits normal muscle tone. Coordination normal.  Skin: Skin is warm and dry. Rash noted. She is not diaphoretic. No erythema. No pallor.     Psychiatric: She has a normal mood and affect. Her behavior is normal.   Results for orders placed or performed in visit on 12/18/13  CBC with Differential  Result Value Ref Range   WBC 5.0 4.0 - 10.5 K/uL   RBC 4.36 3.87 - 5.11 MIL/uL   Hemoglobin 13.0 12.0 - 15.0 g/dL   HCT 38.3 36.0 - 46.0 %   MCV 87.8 78.0 - 100.0 fL   MCH 29.8 26.0 - 34.0 pg   MCHC 33.9 30.0 - 36.0 g/dL   RDW 13.7 11.5 - 15.5 %   Platelets 291 150 - 400 K/uL   Neutrophils Relative % 44 43 - 77 %   Neutro Abs 2.2 1.7 - 7.7 K/uL   Lymphocytes Relative 43 12 - 46 %   Lymphs Abs 2.2 0.7 - 4.0 K/uL   Monocytes Relative 9 3 - 12 %   Monocytes Absolute 0.5 0.1 - 1.0 K/uL   Eosinophils Relative 4 0 - 5 %   Eosinophils Absolute 0.2 0.0 - 0.7 K/uL   Basophils Relative 0 0 - 1 %   Basophils Absolute 0.0 0.0 - 0.1 K/uL   Smear Review Criteria for review not met   Comprehensive metabolic panel  Result Value Ref Range   Sodium 140 135 - 145 mEq/L   Potassium 3.8 3.5 - 5.3 mEq/L   Chloride 103 96 - 112 mEq/L   CO2 28 19 - 32 mEq/L   Glucose, Bld 98 70 - 99 mg/dL   BUN 17 6 - 23 mg/dL   Creat 0.78 0.50 - 1.10 mg/dL   Total Bilirubin 0.8 0.2 - 1.2 mg/dL   Alkaline Phosphatase 62 39 - 117 U/L   AST 26 0 - 37 U/L   ALT 34 0 - 35 U/L   Total Protein 7.1 6.0 - 8.3 g/dL   Albumin 4.3 3.5 - 5.2 g/dL   Calcium 9.6 8.4 - 10.5 mg/dL  Hemoglobin A1c  Result Value Ref Range   Hgb A1c MFr Bld 5.8 (H) <5.7 %   Mean Plasma Glucose 120 (H) <117 mg/dL  Lipid panel  Result Value Ref Range   Cholesterol 174 0 - 200 mg/dL   Triglycerides 222 (H) <150 mg/dL   HDL 40 >39 mg/dL   Total CHOL/HDL Ratio  4.4 Ratio   VLDL 44 (H) 0 - 40 mg/dL   LDL Cholesterol 90 0 - 99 mg/dL  Hepatitis C antibody  Result Value Ref Range   HCV Ab NEGATIVE NEGATIVE  TSH  Result Value Ref  Range   TSH 1.307 0.350 - 4.500 uIU/mL  POCT urinalysis dipstick  Result Value Ref Range   Color, UA yellow    Clarity, UA clear    Glucose, UA neg    Bilirubin, UA neg    Ketones, UA neg    Spec Grav, UA 1.020    Blood, UA mod    pH, UA 6.0    Protein, UA 30    Urobilinogen, UA 0.2    Nitrite, UA neg    Leukocytes, UA small (1+)   IFOBT POC (occult bld, rslt in office)  Result Value Ref Range   IFOBT Negative   Pap IG (Image Guided)  Result Value Ref Range   Specimen adequacy: SEE NOTE    FINAL DIAGNOSIS: SEE NOTE (A)    COMMENTS: SEE NOTE    RECOMMENDATIONS: SEE NOTE    Cytotechnologist: SEE NOTE    Pathologist: SEE NOTE        Assessment & Plan:   1. Essential hypertension, benign   2. Psoriasis   3. Cough   4. History of cervical dysplasia   5. Atypical squamous cells of undetermined significance (ASCUS) on Papanicolaou smear of cervix   6. Hyperlipidemia   7. Glucose intolerance (impaired glucose tolerance)   8. Insomnia   9. Anxiety and depression    1.  HTN: controlled; obtain labs; continue current medications. 2.  Psoriasis: uncontrolled; recommend dermatology evaluation. 3.  Cough: stable; at this time; refill of Tessalon perles and Tylenol #3. 4.  History of cervical dysplasia: with recent ASCUS; desires referral to different gynecologist; referral placed. 5.  ASCUS on vaginal swab: history of cervical dysplasia; refer to different gynecologist. 6.  Hyperlipidemia: controlled; obtain labs; continue current medications. 7.  Glucose intolerance: stable; obtain labs; recommend dietary modification, exercise, weight loss. 8. Insomnia: moderately controlled with Lunesta; continue Lunesta with Trazodone.   9.  Anxiety and  Depression: new; due to declining status of husband's health; rx for Zoloft provided.   10. Colon cancer screening: reminded/recommended patient again to schedule colonoscopy.  Major life stressors interfering with health  maintenance.   Meds ordered this encounter  Medications  . Vitamin D, Cholecalciferol, 1000 UNITS TABS    Sig: Take by mouth daily.  Marland Kitchen b complex vitamins tablet    Sig: Take 1 tablet by mouth daily.  . Acetaminophen-Codeine 300-30 MG per tablet    Sig: Take 1 tablet by mouth every 6 (six) hours as needed for pain.    Dispense:  30 tablet    Refill:  0  . hydrocortisone 2.5 % ointment    Sig: Apply topically 2 (two) times daily.    Dispense:  30 g    Refill:  1  . benzonatate (TESSALON) 100 MG capsule    Sig: Take 1-2 capsules (100-200 mg total) by mouth 3 (three) times daily as needed for cough.    Dispense:  60 capsule    Refill:  1  . sertraline (ZOLOFT) 50 MG tablet    Sig: Take 1 tablet (50 mg total) by mouth daily.    Dispense:  30 tablet    Refill:  5    Return in about 6 months (around 01/09/2015) for complete physical examiniation.  Kristi Elayne Guerin, M.D. [facility] [address] [place], [place]  [address] [phone] phone [phone] fax

## 2014-07-10 ENCOUNTER — Other Ambulatory Visit: Payer: Self-pay

## 2014-07-10 DIAGNOSIS — Z1231 Encounter for screening mammogram for malignant neoplasm of breast: Secondary | ICD-10-CM

## 2014-07-11 ENCOUNTER — Other Ambulatory Visit (INDEPENDENT_AMBULATORY_CARE_PROVIDER_SITE_OTHER): Payer: BLUE CROSS/BLUE SHIELD

## 2014-07-11 DIAGNOSIS — E785 Hyperlipidemia, unspecified: Secondary | ICD-10-CM | POA: Diagnosis not present

## 2014-07-11 DIAGNOSIS — R7302 Impaired glucose tolerance (oral): Secondary | ICD-10-CM

## 2014-07-11 LAB — CBC WITH DIFFERENTIAL/PLATELET
Basophils Absolute: 0.1 10*3/uL (ref 0.0–0.1)
Basophils Relative: 1 % (ref 0–1)
EOS PCT: 4 % (ref 0–5)
Eosinophils Absolute: 0.2 10*3/uL (ref 0.0–0.7)
HCT: 40 % (ref 36.0–46.0)
HEMOGLOBIN: 13.2 g/dL (ref 12.0–15.0)
Lymphocytes Relative: 36 % (ref 12–46)
Lymphs Abs: 1.8 10*3/uL (ref 0.7–4.0)
MCH: 29.7 pg (ref 26.0–34.0)
MCHC: 33 g/dL (ref 30.0–36.0)
MCV: 90.1 fL (ref 78.0–100.0)
MONO ABS: 0.4 10*3/uL (ref 0.1–1.0)
MONOS PCT: 8 % (ref 3–12)
MPV: 9.8 fL (ref 8.6–12.4)
NEUTROS ABS: 2.6 10*3/uL (ref 1.7–7.7)
NEUTROS PCT: 51 % (ref 43–77)
Platelets: 272 10*3/uL (ref 150–400)
RBC: 4.44 MIL/uL (ref 3.87–5.11)
RDW: 13.7 % (ref 11.5–15.5)
WBC: 5.1 10*3/uL (ref 4.0–10.5)

## 2014-07-11 LAB — COMPREHENSIVE METABOLIC PANEL
ALT: 18 U/L (ref 0–35)
AST: 17 U/L (ref 0–37)
Albumin: 4 g/dL (ref 3.5–5.2)
Alkaline Phosphatase: 54 U/L (ref 39–117)
BUN: 19 mg/dL (ref 6–23)
CHLORIDE: 101 meq/L (ref 96–112)
CO2: 33 mEq/L — ABNORMAL HIGH (ref 19–32)
Calcium: 8.9 mg/dL (ref 8.4–10.5)
Creat: 0.68 mg/dL (ref 0.50–1.10)
Glucose, Bld: 86 mg/dL (ref 70–99)
POTASSIUM: 3.5 meq/L (ref 3.5–5.3)
Sodium: 140 mEq/L (ref 135–145)
Total Bilirubin: 0.6 mg/dL (ref 0.2–1.2)
Total Protein: 7.1 g/dL (ref 6.0–8.3)

## 2014-07-11 LAB — LIPID PANEL
CHOLESTEROL: 168 mg/dL (ref 0–200)
HDL: 36 mg/dL — ABNORMAL LOW (ref >=46)
LDL Cholesterol: 90 mg/dL (ref 0–99)
Total CHOL/HDL Ratio: 4.7 Ratio
Triglycerides: 210 mg/dL — ABNORMAL HIGH (ref <150)
VLDL: 42 mg/dL — ABNORMAL HIGH (ref 0–40)

## 2014-07-11 LAB — HEMOGLOBIN A1C
Hgb A1c MFr Bld: 5.8 % — ABNORMAL HIGH (ref <5.7)
Mean Plasma Glucose: 120 mg/dL — ABNORMAL HIGH (ref <117)

## 2014-07-11 NOTE — Telephone Encounter (Signed)
Dr Katrinka BlazingSmith, you just saw pt for check up 2 days ago, but don't see that she discussed this med w/you and it has been a while since you Rxd it. RF?

## 2014-07-11 NOTE — Progress Notes (Signed)
Pt is here for labs only.

## 2014-07-30 ENCOUNTER — Other Ambulatory Visit: Payer: Self-pay

## 2014-07-30 NOTE — Telephone Encounter (Signed)
Pharm reqs RFs of zolpidem. Pended. 

## 2014-07-31 MED ORDER — ZOLPIDEM TARTRATE 5 MG PO TABS: 5.0000 mg | ORAL_TABLET | Freq: Every evening | ORAL | Status: DC | PRN

## 2014-07-31 NOTE — Telephone Encounter (Signed)
Called in refill of Ambien 5mg  one po qhs #30 5 refills to [facility].

## 2014-08-06 ENCOUNTER — Ambulatory Visit: Payer: Self-pay

## 2014-08-27 ENCOUNTER — Telehealth: Payer: Self-pay | Admitting: Family Medicine

## 2014-08-27 ENCOUNTER — Other Ambulatory Visit: Payer: Self-pay | Admitting: Family Medicine

## 2014-08-27 NOTE — Telephone Encounter (Signed)
Faxed prescription to [facility] on [address] for eszopiclone 3 mg on 08/27/14

## 2014-09-02 ENCOUNTER — Other Ambulatory Visit: Payer: Self-pay

## 2014-09-02 ENCOUNTER — Ambulatory Visit
Admission: RE | Admit: 2014-09-02 | Discharge: 2014-09-02 | Disposition: A | Discharge status: Home / Self Care | Payer: BLUE CROSS/BLUE SHIELD | Source: Ambulatory Visit

## 2014-09-02 ENCOUNTER — Ambulatory Visit: Payer: Self-pay

## 2014-09-02 DIAGNOSIS — Z1231 Encounter for screening mammogram for malignant neoplasm of breast: Secondary | ICD-10-CM

## 2014-09-29 ENCOUNTER — Other Ambulatory Visit: Payer: Self-pay | Admitting: Physician Assistant

## 2014-10-04 ENCOUNTER — Other Ambulatory Visit: Payer: Self-pay | Admitting: Family Medicine

## 2014-10-06 NOTE — Telephone Encounter (Signed)
Dr Katrinka Blazing, you saw pt in May for check up w/f/up in 6 mos, but I don't see this med discussed. Do you want to give RFs?

## 2014-11-20 ENCOUNTER — Telehealth: Payer: Self-pay

## 2014-11-20 NOTE — Telephone Encounter (Signed)
Patient called to check on her referral to a gynecologist.  She said she hadn't heard anything since her OV in 5/16.  I checked on the referral from May, and read the note from Dr. Macon Large stating that she would need to wait for another pap smear in 10/16 and if it's still abnormal then she can be seen.  The patient is concerned that the doctor at the [facility] outpatient clinic had not been informed about her medical history when the referral was sent.  The patient wants Dr. Katrinka Blazing to explain her history to a gynecologist so that she is not brushed aside again.  The patient said she had abnormal pap smears in 2014 and 2015.  She has severe dysplasia which involved laser surgery in 1985 and 1990. She had a hysterectomy in 2014 because of the uterine fibroids and her history of laser treatments.  She is currently experiencing daily stomach issues - bloating and pain.  She is concerned that it is connected gynecologically.  She feels as though her issues are not being taken seriously, and she wants a gynecologist to know her history, to understand, and to see her as soon as possible.  Please advise.  Thank you.  The patient is visiting her daughter until Monday, 09/19.  She can be reached at [phone].  She said she gets spotty cell reception there, but her cell # is [phone].

## 2014-11-20 NOTE — Telephone Encounter (Signed)
Dr. Smith

## 2014-11-22 NOTE — Telephone Encounter (Signed)
MyChart message to patient:  Ms. Freet,  I received your message regarding your concern of the gynecologist who reviewed your history and pap smear results not being aware of your history. I have also reviewed your last three pap smears from 2013, 2014, 2015. Your pap smear in 2013 was normal. Your pap smear in 2014 was normal. Your pap smear in 2015 was slightly abnormal. So after review of your history, the gynecologist did not feel that you warranted further immediate work up by a gynecologist at this time. If you are having pelvic pain and bloating, then that may be a very different reason to see a gynecologist, but I did not refer you to gynecology for bloating and pelvic pain; I referred you based on your pap smear results. You will be due for a six month follow-up in the upcoming month and we can discuss in more detail at that time.   Sincerely, Cecilie Lowers, M.D. [facility] [address] [place], [place] [address] [phone] phone [phone] fax

## 2014-11-26 ENCOUNTER — Ambulatory Visit (INDEPENDENT_AMBULATORY_CARE_PROVIDER_SITE_OTHER): Payer: BLUE CROSS/BLUE SHIELD | Admitting: Family Medicine

## 2014-11-26 DIAGNOSIS — Z23 Encounter for immunization: Secondary | ICD-10-CM | POA: Diagnosis not present

## 2014-12-05 ENCOUNTER — Other Ambulatory Visit: Payer: Self-pay | Admitting: Family Medicine

## 2014-12-08 ENCOUNTER — Other Ambulatory Visit: Payer: Self-pay | Admitting: Physician Assistant

## 2014-12-08 ENCOUNTER — Telehealth: Payer: Self-pay

## 2014-12-08 NOTE — Telephone Encounter (Signed)
Got a faxed order from [facility] for a BP monitor for pt. I called pt to verify that she wants this from this company and her daughter answered and stated that her mother does order from this company. She also reported that pt's husband was admitted to [facility] on Saturday. Dr Katrinka Blazing, the order form is in your box.

## 2014-12-08 NOTE — Telephone Encounter (Signed)
Pharmacy called requesting refills for hydrocortisone 2.5% ointment.

## 2014-12-09 MED ORDER — HYDROCORTISONE 2.5 % EX OINT: TOPICAL_OINTMENT | Freq: Two times a day (BID) | CUTANEOUS | Status: DC

## 2014-12-09 NOTE — Telephone Encounter (Signed)
Hydrocortisone needed for psoriasis.   Patient would like Dr. Merla Riches, Dr. Katrinka Blazing, and Porfirio Oar to know that husband had to be hospitalized following visit 12/04/14 for his heart and may need a pacemaker. Would like a call back from Chelle to elaborate.

## 2014-12-09 NOTE — Telephone Encounter (Signed)
Can we refill? 

## 2014-12-09 NOTE — Telephone Encounter (Signed)
Meds ordered this encounter  Medications  . DISCONTD: hydrocortisone 2.5 % ointment    Sig: Apply topically 2 (two) times daily. APPLY TOPICALLY TWICE DAILY    Dispense:  28.35 g    Refill:  0    CYCLE FILL MEDICATION. Authorization is required for next refill.    Order Specific Question:  Supervising Provider    Answer:  Ethelda Chick [[id number]]  . hydrocortisone 2.5 % ointment    Sig: Apply topically 2 (two) times daily. APPLY TOPICALLY TWICE DAILY    Dispense:  28.35 g    Refill:  0    CYCLE FILL MEDICATION. Authorization is required for next refill.    Order Specific Question:  Supervising Provider    Answer:  Tonye Pearson [[id number]]    Her husband was admitted when we sent him to [facility], for suspected sepsis. Initial blood culture is negative. WBC is declining, today is 9. Lactic Acid 5. Pulse Ox low, improved on O2.  CT scan reviewed by surgical oncologist Chestine Spore) and GI Logan Bores) and they planned procedure, which has been delayed due to elevated PT/INR.  Became bradycardic and hypotensive on several occasions and cardiology consulted. Lows are during the night. He has a bundle branch block, but she doesn't recall which side. Metoprolol was stopped.  ECHO essentially stable but slight increase in regurgitation seen as slight on previous ECHO.

## 2014-12-18 NOTE — Telephone Encounter (Signed)
Form signed for faxing

## 2014-12-23 ENCOUNTER — Other Ambulatory Visit: Payer: Self-pay | Admitting: Family Medicine

## 2015-01-07 ENCOUNTER — Encounter: Payer: BLUE CROSS/BLUE SHIELD | Admitting: Family Medicine

## 2015-01-09 ENCOUNTER — Encounter: Payer: BLUE CROSS/BLUE SHIELD | Admitting: Family Medicine

## 2015-01-10 ENCOUNTER — Other Ambulatory Visit: Payer: Self-pay | Admitting: Family Medicine

## 2015-01-18 ENCOUNTER — Other Ambulatory Visit: Payer: Self-pay | Admitting: Family Medicine

## 2015-01-25 ENCOUNTER — Other Ambulatory Visit: Payer: Self-pay | Admitting: Family Medicine

## 2015-02-08 ENCOUNTER — Other Ambulatory Visit: Payer: Self-pay | Admitting: Family Medicine

## 2015-02-09 ENCOUNTER — Other Ambulatory Visit: Payer: Self-pay | Admitting: Family Medicine

## 2015-02-24 ENCOUNTER — Other Ambulatory Visit: Payer: Self-pay | Admitting: Family Medicine

## 2015-02-26 ENCOUNTER — Other Ambulatory Visit: Payer: Self-pay | Admitting: Family Medicine

## 2015-03-07 ENCOUNTER — Other Ambulatory Visit: Payer: Self-pay | Admitting: Family Medicine

## 2015-03-26 ENCOUNTER — Other Ambulatory Visit: Payer: Self-pay | Admitting: Family Medicine

## 2015-04-02 ENCOUNTER — Telehealth: Payer: Self-pay

## 2015-04-02 NOTE — Telephone Encounter (Signed)
Patient wanted to let Dr Katrinka Blazing know she had to cancel her CPE appointment with her for Feb. She stated she is unable to afford health insurance at this time and cant afford to come in. She is requesting if "lovastatin", "Cozaar" to be refilled. She is requesting the lovastatin to be changed to 20 mg because this strength is available for $4. She is wanting to know if Dr Katrinka Blazing can check to see if their is a medication other than Cozaar that may be cheaper price ($4). Patient has switched to [facility] [place]. Patients call back number is [phone]

## 2015-04-03 ENCOUNTER — Encounter: Payer: BLUE CROSS/BLUE SHIELD | Admitting: Family Medicine

## 2015-04-06 NOTE — Telephone Encounter (Signed)
Please see note from pt below. Pended 20 mg lovastatin.

## 2015-04-16 MED ORDER — LOVASTATIN 20 MG PO TABS: 40.0000 mg | ORAL_TABLET | Freq: Every day | ORAL | Status: DC

## 2015-04-16 NOTE — Telephone Encounter (Signed)
Call --- Lisinopril is much cheaper than Losartan.  Can patient take Lisinopril or did we stop it due to concern for cough with Lisinopril?  I sent in Lovastatin .

## 2015-04-20 ENCOUNTER — Encounter: Payer: Self-pay | Admitting: Family Medicine

## 2015-04-20 DIAGNOSIS — R87620 Atypical squamous cells of undetermined significance on cytologic smear of vagina (ASC-US): Secondary | ICD-10-CM

## 2015-04-20 DIAGNOSIS — Z87410 Personal history of cervical dysplasia: Secondary | ICD-10-CM

## 2015-04-20 NOTE — Telephone Encounter (Signed)
Spoke with patient; no insurance so unable to come in; [place] is too expensive.  Husband's insurance covered patient until the end of the year.  With Lisinopril had a cough.  Has been taking 1/2 Metoprolol qhs. Increases to 1 tablet daily for palpitations.  A/P: HTN: recommend checking blood pressure for the next week to determine what blood pressure is running; out of Losartan for one month.  Notify provider via MyChart.

## 2015-05-19 ENCOUNTER — Encounter: Payer: Self-pay | Admitting: Obstetrics & Gynecology

## 2015-06-17 ENCOUNTER — Encounter: Payer: BLUE CROSS/BLUE SHIELD | Admitting: Obstetrics & Gynecology

## 2015-07-09 ENCOUNTER — Ambulatory Visit (INDEPENDENT_AMBULATORY_CARE_PROVIDER_SITE_OTHER): Payer: BLUE CROSS/BLUE SHIELD | Admitting: Obstetrics and Gynecology

## 2015-07-09 ENCOUNTER — Encounter: Payer: Self-pay | Admitting: Obstetrics and Gynecology

## 2015-07-09 VITALS — BP 140/75 | HR 86 | Ht 59.0 in | Wt 171.6 lb

## 2015-07-09 DIAGNOSIS — Z124 Encounter for screening for malignant neoplasm of cervix: Secondary | ICD-10-CM

## 2015-07-09 DIAGNOSIS — Z01419 Encounter for gynecological examination (general) (routine) without abnormal findings: Secondary | ICD-10-CM

## 2015-07-09 DIAGNOSIS — Z1151 Encounter for screening for human papillomavirus (HPV): Secondary | ICD-10-CM

## 2015-07-09 NOTE — Progress Notes (Signed)
Subjective:     Tammy [name] is a 60 y.o. female G2P2 with BMI 34 who is here for a comprehensive physical exam. The patient reports no problems. She has had a lapse in medical care for 2 years due to lack of medical insurance. She has had a history of cervical dysplasia and underwent a TAH in 2002. In 2015, a vaginal pap smear of ASCUS +HPV was obtained without clear follow up plan. Patient is currently without complaints. She denies any vaginal bleeding or pelvic pain. She is occasionally sexually active without complaints other than some mild vaginal dryness. She denies any urinary incontinence but does suffer from interstitial cystitis. She is no longer experiencing vasomotor symptoms  Past Medical History  Diagnosis Date  . Gastric ulcer, unspecified as acute or chronic, without mention of hemorrhage, perforation, or obstruction   . Dysplasia of cervix, unspecified   . Personal history of colonic polyps   . Irritable bowel syndrome   . Essential hypertension, benign   . Carpal tunnel syndrome   . Insomnia, unspecified   . Esophageal reflux   . Diaphragmatic hernia without mention of obstruction or gangrene   . Pure hypercholesterolemia   . Allergic rhinitis, cause unspecified   . Mixed incontinence urge and stress (female)(female)   . Chronic interstitial cystitis   . Methicillin resistant Staphylococcus aureus in conditions classified elsewhere and of unspecified site   . Hiatal hernia   . Colon polyps 03/07/2004   Past Surgical History  Procedure Laterality Date  . Tonsillectomy and adenoidectomy    . Carpal tunnel release    . Abdominal hysterectomy  03/07/2000    Myoma with abnormal uterine bleeding; benign pathology; OVARIES INTACT  . Colonoscopy  03/07/2004    colon polyps single.  [place].   Family History  Problem Relation Age of Onset  . Hyperlipidemia Mother   . Hypertension Mother   . Hypertension Father   . Coronary artery disease Father     stents  . Heart disease  Father 48    stenting  . Hyperlipidemia Father      Social History   Social History  . Marital Status: Married    Spouse Name: N/A  . Number of Children: 2  . Years of Education: N/A   Occupational History  . 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 Smoking status: Never Smoker   . Smokeless tobacco: Not on file  . Alcohol Use: Yes     Comment: occasional once per month on average. Twisted [employer].  . Drug Use: Not on file  . Sexual Activity: Not on file   Other Topics Concern  . Not on file   Social History Narrative       Marital status:  Married x 39 years; happy; no abuse. Husband on long term disability.       Lives with: husband; Pt's daughter and grandchild also live in the home.        Children:  2 children,1 stepchild and 5 grandchildren.        Tobacco: No tobacco history;       Alcohol:  +drinks twice monthly;       Drugs:  no drugs.        Exercise:  None      Seatbelt:  100%         Health Maintenance  Topic Date Due  . HIV Screening  04/28/1970  .  ZOSTAVAX  04/28/2014  . COLONOSCOPY  09/13/2015  . INFLUENZA VACCINE  10/06/2015  . MAMMOGRAM  09/01/2016  . PAP SMEAR  12/18/2016  . TETANUS/TDAP  08/14/2022  . Hepatitis C Screening  Completed       Review of Systems Pertinent items are noted in HPI.   Objective:  Blood pressure 140/75, pulse 86, height 4\' 11"  (1.499 m), weight 171 lb 9.6 oz (77.837 kg).     GENERAL: Well-developed, well-nourished female in no acute distress.  HEENT: Normocephalic, atraumatic. Sclerae anicteric.  NECK: Supple. Normal thyroid.  LUNGS: Clear to auscultation bilaterally.  HEART: Regular rate and rhythm. BREASTS: Symmetric in size. No palpable masses or lymphadenopathy, skin changes, or nipple drainage. ABDOMEN: Soft, nontender, nondistended. No organomegaly. PELVIC: Normal external female genitalia. Vagina is mildly atrophic.  Normal discharge.  No adnexal mass or tenderness. EXTREMITIES: No cyanosis, clubbing, or edema, 2+ distal pulses.    Assessment:    Healthy female exam.      Plan:    Vaginal pap smear collected Referral for screening mammogram provided via Scholarship fund Referral to establish care with PCP provided to assist with management of CHTN and hyperlipidemia Advised patient to complete financial packet information  Patient will be contacted with any abnormal results See After Visit Summary for Counseling Recommendations

## 2015-07-10 LAB — CYTOLOGY - PAP

## 2015-07-11 ENCOUNTER — Telehealth: Payer: Self-pay

## 2015-07-11 NOTE — Telephone Encounter (Signed)
Pt states shw saw GYN on 5/4 and results of PAP to be sent to Dr. Katrinka BlazingSmith. Pt states employee at [employer] said to tell Dr. Katrinka BlazingSmith hello !

## 2015-07-13 NOTE — Telephone Encounter (Signed)
Noted  

## 2015-07-15 ENCOUNTER — Telehealth: Payer: Self-pay

## 2015-07-15 NOTE — Telephone Encounter (Signed)
Per Dr. Jolayne Pantheronstant, pt needs a pap with cotesting in one year HPV not detected.  Called pt and LM to return call to the Clinics.

## 2015-07-16 NOTE — Telephone Encounter (Signed)
SPoke with pt, she wanted to know if you received her pap results. Is it normal? Should she go furthur with this with colposcopy and biopsy. She is concerned about this and vaginal cancer and feels she has the same result as last year?

## 2015-07-16 NOTE — Telephone Encounter (Signed)
Tammy [name] called yesterday afternnoon and left  A message requesting pap results. States when she made her mammogram appt they told her results were there, but they couldn't give them to her. Also said she had checked MyChart and wasn't there.   I called Tammy [name] and left a message we were returning her call - please call back and if we may leave detailed Information on voicemail, let us know that.

## 2015-07-17 ENCOUNTER — Telehealth: Payer: Self-pay | Admitting: *Deleted

## 2015-07-17 NOTE — Telephone Encounter (Addendum)
Pt left message with additional questions about her recent Pap test. She wanted to know if the test for HPV was performed and the results. She also wanted to compare her last Pap results (2015) with the recent one to see if there is any difference. She stated that the Pap results are not posted to My Chart.  5/19  1340  Called pt and was told by a female that pt is not at home right now. I stated that I will call back next week. * Per chart review, pt had negative HPV on Pap done 07/09/15. The Pap result (ASCUS) is unchanged from the last Pap test done 12/18/13.  Plan of care by Dr. Jolayne Pantheronstant is that pt needs a Pap w/co-testing in 1 year. There is no indication for additional evaluation or treatment (i.e. Colpo) at this time.

## 2015-07-20 ENCOUNTER — Encounter: Payer: Self-pay | Admitting: Internal Medicine

## 2015-07-20 ENCOUNTER — Ambulatory Visit (INDEPENDENT_AMBULATORY_CARE_PROVIDER_SITE_OTHER): Payer: Self-pay | Admitting: Internal Medicine

## 2015-07-20 VITALS — BP 141/81 | HR 85 | Temp 98.4°F | Ht 60.0 in | Wt 169.7 lb

## 2015-07-20 DIAGNOSIS — Z8679 Personal history of other diseases of the circulatory system: Secondary | ICD-10-CM

## 2015-07-20 DIAGNOSIS — L409 Psoriasis, unspecified: Secondary | ICD-10-CM

## 2015-07-20 DIAGNOSIS — E785 Hyperlipidemia, unspecified: Secondary | ICD-10-CM

## 2015-07-20 DIAGNOSIS — I10 Essential (primary) hypertension: Secondary | ICD-10-CM

## 2015-07-20 DIAGNOSIS — N3010 Interstitial cystitis (chronic) without hematuria: Secondary | ICD-10-CM | POA: Insufficient documentation

## 2015-07-20 DIAGNOSIS — Z87440 Personal history of urinary (tract) infections: Secondary | ICD-10-CM

## 2015-07-20 DIAGNOSIS — L4050 Arthropathic psoriasis, unspecified: Secondary | ICD-10-CM | POA: Insufficient documentation

## 2015-07-20 DIAGNOSIS — R002 Palpitations: Secondary | ICD-10-CM

## 2015-07-20 MED ORDER — HYDROCHLOROTHIAZIDE 25 MG PO TABS: 25.0000 mg | ORAL_TABLET | Freq: Every day | ORAL | Status: DC

## 2015-07-20 MED ORDER — TRIAMCINOLONE ACETONIDE 0.1 % EX CREA: 1.0000 "application " | TOPICAL_CREAM | Freq: Two times a day (BID) | CUTANEOUS | Status: DC

## 2015-07-20 MED ORDER — METOPROLOL TARTRATE 25 MG PO TABS: 12.5000 mg | ORAL_TABLET | Freq: Every day | ORAL | Status: DC | PRN

## 2015-07-20 MED ORDER — OMEPRAZOLE 20 MG PO CPDR: 20.0000 mg | DELAYED_RELEASE_CAPSULE | Freq: Two times a day (BID) | ORAL | Status: DC

## 2015-07-20 NOTE — Patient Instructions (Signed)
We have faxed over the starred prescriptions to the pharmacy.  We will be able to refer you for a colonoscopy once we [facility].  Welcome to the clinic!

## 2015-07-20 NOTE — Assessment & Plan Note (Addendum)
Overview She notes several lesions on the extensor surfaces bilaterally of elbows and knees. In the past, she was diagnosed with psoriasis, and she is wondering if she can try something stronger than hydrocortisone 2.5% ointment.  Assessment Psoriasis  Plan -Prescribed triamcinolone 0.1% cream to be applied to affected areas twice daily with plan to revisit at follow-up

## 2015-07-20 NOTE — Assessment & Plan Note (Signed)
Overview Her blood pressure today is 141/81. She is currently out of medication and is taking her husband's medications: HCTZ 25 mg daily, Lasix 20 mg daily, metoprolol 12.5 mg daily as needed for palpitations. She was on losartan 100 mg at one point after being switched off from lisinopril due to cough.  Assessment Blood pressure mildly elevated from goal less than 140/90 given possible comorbid diabetes.  Plan -Refilled HCTZ 25 mg today with plan to recheck at follow-up -Defer BMET at this visit in the absence of coverage though suspect her electrolytes are within normal limits as she has been on this medication the past

## 2015-07-20 NOTE — Progress Notes (Signed)
   Subjective:    Patient ID: Tammy [name], female    DOB: November 23, 1955, 60 y.o.   MRN: [id number]  HPI Tammy [name] is a 60 year old female with history of abnormal Pap smears, palpitations, colon polyp, esophageal stricture, GERD, hypertension, prediabetes, hyperlipidemia who presents today for establishing care with our clinic. Please see assessment & plan for status of chronic medical problems.  Past Surgical History  Procedure Laterality Date  . Tonsillectomy and adenoidectomy    . Carpal tunnel release      Bilateral  . Abdominal hysterectomy  03/07/2000    Myoma with abnormal uterine bleeding; benign pathology; OVARIES INTACT  . Colonoscopy  03/07/2004    colon polyps single.  [place].   Family History  Problem Relation Age of Onset  . Hyperlipidemia Mother   . Hypertension Mother   . Hypertension Father   . Coronary artery disease Father 75    stents  . Hyperlipidemia Father    Social History   Social History  . Marital Status: Married    Spouse Name: N/A  . Number of Children: 2  . Years of Education: N/A   Occupational History  . 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 Smoking status: Never Smoker   . Smokeless tobacco: Not on file  . Alcohol Use: 0.0 oz/week    0 Standard drinks or equivalent per week     Comment: Occassional use: <3 drinks/month  . Drug Use: Not on file  . Sexual Activity:    Partners: Male   Other Topics Concern  . Not on file   Social History Narrative   Marital status:  Married since 1976; happy; no abuse. Husband on long term disability.       Lives with: husband; Pt's daughter and grandchild also live in the home.        Children:  2 children, 1 stepchild and 5 grandchildren.        Exercise:  None        Review of Systems  Constitutional: Negative for appetite change.  Respiratory: Negative for shortness of breath.   Cardiovascular: Positive for chest pain  and leg swelling.  Gastrointestinal: Positive for abdominal pain and diarrhea. Negative for nausea and vomiting.  Genitourinary: Negative for vaginal bleeding, vaginal discharge and menstrual problem.  Skin: Positive for rash.  Neurological: Negative for dizziness.  Psychiatric/Behavioral: Positive for sleep disturbance.       Objective:   Physical Exam  Constitutional: She is oriented to person, place, and time. She appears well-developed and well-nourished. No distress.  HENT:  Head: Normocephalic and atraumatic.  Eyes: Conjunctivae are normal. No scleral icterus.  Neck: No tracheal deviation present.  Cardiovascular: Normal rate and regular rhythm.  Exam reveals no gallop and no friction rub.   No murmur heard. Pulmonary/Chest: Effort normal and breath sounds normal. No stridor. No respiratory distress. She has no wheezes. She has no rales.  Abdominal: Soft. Bowel sounds are normal. She exhibits no distension and no mass. There is no tenderness. There is no rebound and no guarding.  Neurological: She is alert and oriented to person, place, and time.  Skin: Skin is warm and dry. She is not diaphoretic.  Psychiatric:  Anxious          Assessment & Plan:

## 2015-07-20 NOTE — Assessment & Plan Note (Signed)
Overview [facility] and was last seen in 2015. She is requesting daily antibiotic prophylaxis for her interstitial cystitis.  Assessment Presumed interstitial cystitis. Unclear the role of chronic antibiotic therapy. She has not been able to follow-up since 2015 with this provider to reassess the status of her disease.  Plan -Deferred refilling her antibiotic therapy to which she expressed some hesitation given that it alleviated her symptoms in the past.  -Revisit at follow-up

## 2015-07-20 NOTE — Assessment & Plan Note (Signed)
Overview She reports taking lovastatin 20 mg at bedtime though EMR notes the dose to be 40 mg. She has been off this medication for some time. She does note some family history on her father's side notable for coronary artery disease though not on her mother's side.  Assessment History of hyperlipidemia. Per 2013 AHA/ACC ASCVD risk calculator, 10-year risk is 6.8 %. No statin therapy is thus indicated though this calculation was made using lab work from last year. Cardiac catheterization from October 2012 is unremarkable for coronary artery disease.   Plan -Repeat lipid profile once she has established coverage -Continue working on weight loss and blood pressure control in the interval

## 2015-07-20 NOTE — Assessment & Plan Note (Signed)
Overview She reports taking metoprolol 12.5 mg daily as needed for palpitations she experiences. Unclear of prior workup for etiology per the patient.  Assessment Palpitations of unknown etiology  Plan -Refilled metoprolol 12.5 mg daily as needed for palpitations with plan to revisit at follow-up visit

## 2015-07-21 NOTE — Addendum Note (Signed)
Addended by: Beather ArbourPATEL, Kyndahl Jablon V on: 07/21/2015 05:23 PM   Modules accepted: Level of Service

## 2015-07-21 NOTE — Addendum Note (Signed)
Addended by: Beather ArbourPATEL, Kathlee Barnhardt V on: 07/21/2015 01:19 PM   Modules accepted: Level of Service

## 2015-07-21 NOTE — Addendum Note (Signed)
Addended by: Doneen PoissonKLIMA, Rilei Kravitz D on: 07/21/2015 10:35 AM   Modules accepted: Level of Service

## 2015-07-21 NOTE — Progress Notes (Signed)
Case discussed with Dr. Patel at the time of the visit.  We reviewed the resident's history and exam and pertinent patient test results.  I agree with the assessment, diagnosis, and plan of care documented in the resident's note. 

## 2015-07-22 ENCOUNTER — Telehealth: Payer: Self-pay | Admitting: Internal Medicine

## 2015-07-22 NOTE — Telephone Encounter (Signed)
APT. REMINDER CALL, LMTCB °

## 2015-07-23 ENCOUNTER — Ambulatory Visit: Payer: Self-pay

## 2015-07-27 NOTE — Telephone Encounter (Signed)
Called patient and discussed with her pap results from past 3 years, hpv testing, & pap screening guidelines. Also discussed with patient that guidelines have changed significantly since her procedures were done so that changes the previous advice she had been given. Also discussed with patient that if we were concerned with her results we would be taking biopsies or seeing her sooner. Patient verbalized understanding to all & just wanted to make sure we knew the whole story. Patient had no other questions

## 2015-07-30 ENCOUNTER — Ambulatory Visit: Payer: Self-pay

## 2015-07-30 ENCOUNTER — Other Ambulatory Visit: Payer: Self-pay | Admitting: Family Medicine

## 2015-08-02 NOTE — Telephone Encounter (Signed)
Spoke with patient; explained current pap smear guidelines in detail.  Also explained how guidelines for abnormal pap smears has significantly changed in the past 20-30 years.  Agree with recommendations per gynecology.

## 2015-08-21 ENCOUNTER — Other Ambulatory Visit: Payer: Self-pay | Admitting: Obstetrics and Gynecology

## 2015-08-21 DIAGNOSIS — Z1231 Encounter for screening mammogram for malignant neoplasm of breast: Secondary | ICD-10-CM

## 2015-09-03 ENCOUNTER — Ambulatory Visit (HOSPITAL_COMMUNITY): Payer: Self-pay

## 2015-09-10 ENCOUNTER — Ambulatory Visit (HOSPITAL_COMMUNITY): Payer: Self-pay

## 2015-09-12 ENCOUNTER — Other Ambulatory Visit: Payer: Self-pay | Admitting: Internal Medicine

## 2015-09-14 NOTE — Telephone Encounter (Signed)
Seen once as a new patient on 07/20/2015 by Dr Heywood Ilesushil Patel.  Will forward refill request to DrPatel for review. Please advise.Tammy [name], Tammy Furney Cassady7/10/20179:40 AM

## 2015-09-14 NOTE — Telephone Encounter (Signed)
Per my last visit, she was to follow-up for blood pressure recheck, and her HCTZ prescription should have since run out along with metoprolol.   Please schedule patient for follow-up appointment; if I'm not available for an appointment within the next 30 days, then schedule with another provider.  I want to assess her interest in following with us as a PCP office, and if so, we want to make sure we take care of all of her chronic conditions. Let's also find out if she has a pharmacy in mind and plan to move forward with [employer] paperwork.  Thank you.

## 2015-10-14 ENCOUNTER — Telehealth (HOSPITAL_COMMUNITY): Payer: Self-pay | Admitting: *Deleted

## 2015-10-14 NOTE — Telephone Encounter (Signed)
Telephoned patient at home # and left message with female about [facility] Thursday August 10 11:00

## 2015-10-15 ENCOUNTER — Ambulatory Visit (HOSPITAL_COMMUNITY)
Admission: RE | Admit: 2015-10-15 | Discharge: 2015-10-15 | Disposition: A | Discharge status: Home / Self Care | Payer: Self-pay | Source: Ambulatory Visit | Attending: Obstetrics and Gynecology | Admitting: Obstetrics and Gynecology

## 2015-10-15 ENCOUNTER — Ambulatory Visit
Admission: RE | Admit: 2015-10-15 | Discharge: 2015-10-15 | Disposition: A | Discharge status: Home / Self Care | Payer: No Typology Code available for payment source | Source: Ambulatory Visit | Attending: Obstetrics and Gynecology | Admitting: Obstetrics and Gynecology

## 2015-10-15 ENCOUNTER — Encounter (HOSPITAL_COMMUNITY): Payer: Self-pay

## 2015-10-15 VITALS — BP 104/62 | Temp 98.7°F | Ht 60.0 in | Wt 173.4 lb

## 2015-10-15 DIAGNOSIS — Z1239 Encounter for other screening for malignant neoplasm of breast: Secondary | ICD-10-CM

## 2015-10-15 DIAGNOSIS — Z1231 Encounter for screening mammogram for malignant neoplasm of breast: Secondary | ICD-10-CM

## 2015-10-15 HISTORY — DX: Essential (primary) hypertension: I10

## 2015-10-15 NOTE — Progress Notes (Signed)
Complaints of occasional left breast tenderness.   Pap Smear: Pap smear not completed today. Last Pap smear [facility] and ASCUS with negative HPV. Per patient has a history of multiple abnormal Pap smears that started in the mid 1980's. Patient states she has a history of multiple colposcopies and procedures that included possible LEEP versus CKC. Patient has a history of a hysterectomy 12/21/2000 for fibroids and AUB. Last Pap smear result is in EPIC.  Physical exam: Breasts Breasts symmetrical. No skin abnormalities bilateral breasts. No nipple retraction bilateral breasts. No nipple discharge bilateral breasts. No lymphadenopathy. No lumps palpated bilateral breasts. Complaints of some tenderness when palpated left inner breast on exam. [facility]. Appointment scheduled for Thursday, October 15, 2015 at 1140.        Pelvic/Bimanual No Pap smear completed today since last Pap smear was 07/09/2015. Pap smear not indicated per BCCCP guidelines.   Smoking History: Patient has never smoked.  Patient Navigation: Patient education provided. Access to services provided for patient through BCCCP program.   Colorectal Cancer Screening: Patient had a colonoscopy completed 10 years ago. No complaints today.

## 2015-10-15 NOTE — Addendum Note (Signed)
Encounter addended by: Priscille Heidelberghristine P Lanai Conlee, RN on: 10/15/2015  1:21 PM<BR>    Actions taken: Sign clinical note

## 2015-10-15 NOTE — Patient Instructions (Addendum)
Explained self breast awareness to Leane PlattLynn M Steinberg. Patient did not need a Pap smear today due to last Pap smear was 07/09/2015. Let patient know that next Pap smear is due in one year due to her history of abnormal Pap smears. [facility] screening mamm[facility]. Appointment scheduled for Thursday, October 15, 2015 at 1140. Let patient know that the Breast Center will follow up with her within the next couple of weeks with results of mammogram by letter or phone. Leane PlattLynn M Allan verbalized understanding.  Maille Halliwell, Kathaleen Maserhristine Poll, RN 12:52 PM

## 2015-10-19 ENCOUNTER — Encounter (HOSPITAL_COMMUNITY): Payer: Self-pay | Admitting: *Deleted

## 2015-11-06 ENCOUNTER — Ambulatory Visit: Payer: No Typology Code available for payment source

## 2015-11-11 ENCOUNTER — Other Ambulatory Visit: Payer: Self-pay | Admitting: Internal Medicine

## 2015-11-18 NOTE — Telephone Encounter (Signed)
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  Her husband has been ill and has been taking care of him.  She cant not afford the $25 copay for uninsured patients at this time, but will call back to make an appt when she gets her "orange card".Tammy Alvine.[name], Tammy Cassady9/13/201710:39 AM

## 2016-03-14 ENCOUNTER — Other Ambulatory Visit: Payer: Self-pay | Admitting: Internal Medicine

## 2016-03-14 NOTE — Telephone Encounter (Signed)
Called both listed ph #, no answer. Left msg to call back.

## 2016-03-15 ENCOUNTER — Other Ambulatory Visit: Payer: Self-pay | Admitting: *Deleted

## 2016-03-16 ENCOUNTER — Telehealth: Payer: Self-pay | Admitting: Internal Medicine

## 2016-03-16 NOTE — Telephone Encounter (Signed)
I am giving her this prescription, but she need to be seen in clinic before any further refills.

## 2016-03-16 NOTE — Telephone Encounter (Signed)
APT. REMINDER CALL, LMTCB °

## 2016-03-17 ENCOUNTER — Ambulatory Visit (INDEPENDENT_AMBULATORY_CARE_PROVIDER_SITE_OTHER): Payer: BLUE CROSS/BLUE SHIELD | Admitting: Internal Medicine

## 2016-03-17 VITALS — BP 133/82 | HR 58 | Temp 98.0°F | Ht 60.0 in | Wt 173.7 lb

## 2016-03-17 DIAGNOSIS — K635 Polyp of colon: Secondary | ICD-10-CM

## 2016-03-17 DIAGNOSIS — Z862 Personal history of diseases of the blood and blood-forming organs and certain disorders involving the immune mechanism: Secondary | ICD-10-CM

## 2016-03-17 DIAGNOSIS — L409 Psoriasis, unspecified: Secondary | ICD-10-CM | POA: Diagnosis not present

## 2016-03-17 DIAGNOSIS — E785 Hyperlipidemia, unspecified: Secondary | ICD-10-CM

## 2016-03-17 DIAGNOSIS — Z23 Encounter for immunization: Secondary | ICD-10-CM | POA: Diagnosis not present

## 2016-03-17 DIAGNOSIS — K219 Gastro-esophageal reflux disease without esophagitis: Secondary | ICD-10-CM | POA: Diagnosis not present

## 2016-03-17 DIAGNOSIS — Z86010 Personal history of colonic polyps: Secondary | ICD-10-CM

## 2016-03-17 DIAGNOSIS — Z79899 Other long term (current) drug therapy: Secondary | ICD-10-CM

## 2016-03-17 DIAGNOSIS — Z0000 Encounter for general adult medical examination without abnormal findings: Secondary | ICD-10-CM | POA: Insufficient documentation

## 2016-03-17 MED ORDER — HYDROCORTISONE 1 % EX CREA: TOPICAL_CREAM | CUTANEOUS | 1 refills | Status: AC

## 2016-03-17 MED ORDER — TRIAMCINOLONE ACETONIDE 0.1 % EX CREA: TOPICAL_CREAM | CUTANEOUS | 0 refills | Status: DC

## 2016-03-17 MED ORDER — PANTOPRAZOLE SODIUM 40 MG PO TBEC: 40.0000 mg | DELAYED_RELEASE_TABLET | Freq: Every day | ORAL | 1 refills | Status: DC

## 2016-03-17 NOTE — Assessment & Plan Note (Signed)
Reports the rash has gotten worse despite increase in steroid cream. Does report that she has been out of the cream for a couple months. Has an appointment with dermatology in February. Also notes a sore on the tip of her nose. This sore has resolved but still somewhat swolen and painful.  Today she has large thick, red, plaque covered with silvery scales arcross her left shin with several smaller lesions across her right skin, bilateral knees, bilateral arms (most notably elbows, with a few scatter lesions on her face.   Plan Re-prescribed triamcinolone 0.1% cream to be applied to affected areas 2-4 times daily on her body as she has not been on anything for several months Will prescribe hydrocortisone cream 1% for the lesions on her face

## 2016-03-17 NOTE — Assessment & Plan Note (Signed)
Reports she gets daily heartburn. Reports several endoscopies with ulcers. Was on Protonix bid previously but has been unable to afford. Has been taking antacids daily. No nausea or vomiting. No melena or hematochezia.  Plan Trial of Protonix 40 mg daily

## 2016-03-17 NOTE — Patient Instructions (Signed)
[name],  I would like you to try the triamcinolone cream to the lesion on your body. Do not use this cream on your face. I am going to give you a lower dose steroid cream to use on your face, the hydrocortisone cream.   I am going to prescribe you Protonix 40 mg daily and get you set up to be seen by GI for a colonoscopy.   We will check your lipid panel today and see if you need to go back on a statin.  I would like you to follow up with me in clinic in 1-2 months.

## 2016-03-17 NOTE — Assessment & Plan Note (Signed)
Reports history of anemia and currently on Fe supplement  Plan Checking CBC today

## 2016-03-17 NOTE — Assessment & Plan Note (Addendum)
She had been on a statin in the past but stopped due to loss of insurance. She was able to get insurance in January. Requesting lipid panel and resumptoin fo statin therpay.  Plan Lipid panel and CMET today Consider statin therapy pending Lipid panel results  Addendum TC 244 HDL 34 and LDL 159. Will start Pravastatin 40 mg daily. ASCVD 9.2%.

## 2016-03-17 NOTE — Assessment & Plan Note (Signed)
Flu shot today 

## 2016-03-17 NOTE — Progress Notes (Signed)
   CC: Psoriasis  HPI:  Tammy [name] is a 61 y.o. female with a past medical history listed below here today with complaints of worsening psoriasis rash and medication refills.   Lipid medication - She had been on a statin in the past but stopped due to loss of insurance. She was able to get insurance in January. Requesting lipid panel and resumptoin fo statin therpay.  Psoriasis - Reports the rash has gotten worse despite increase in steroid cream. Does report that she has been out of the cream for a couple months. Has an appointment with dermatology in February. Also notes a sore on the tip of her nose. This sore has resolved but still somewhat swolen and painful.  GERD - Reports she gets daily heartburn. Reports several endoscopies with ulcers. Was on Protonix bid previously but has been unable to afford. Has been taking antacids daily. No nausea or vomiting. No melena or hematochezia.  Colon polyps - Was suppose to have colonsocpy after 5 years but has not been back in >12 years. Seen by [place].   Past Medical History:  Diagnosis Date  . Allergic rhinitis, cause unspecified   . Carpal tunnel syndrome   . Chronic interstitial cystitis   . Colon polyps 03/07/2004  . Diaphragmatic hernia without mention of obstruction or gangrene   . Dysplasia of cervix, unspecified   . Esophageal reflux   . Essential hypertension, benign   . Gastric ulcer, unspecified as acute or chronic, without mention of hemorrhage, perforation, or obstruction   . Hiatal hernia   . Hypertension   . Insomnia, unspecified   . Irritable bowel syndrome   . Lung nodule 11/27/2011   CXR 11/14/2011-nodule versus osteophyte projecting over thoracic spine on lateral view CT chest 11/24/2011- confirms osteophyte with 2 small nodules right lower lobe and left lower lobe. CT 06/01/12- stable for 5 years.  No further follow-up necessary.   . Methicillin resistant Staphylococcus aureus in conditions classified elsewhere  and of unspecified site   . Mixed incontinence urge and stress (female)(female)   . Personal history of colonic polyps   . Pure hypercholesterolemia     Review of Systems:   Negative except as noted in HPI  Physical Exam:  Vitals:   03/17/16 1017  BP: 133/82  Pulse: (!) 58  Temp: 98 F (36.7 C)  TempSrc: Oral  SpO2: 97%  Weight: 173 lb 11.2 oz (78.8 kg)  Height: 5' (1.524 m)   Constitutional: She is oriented to person, place, and time. She appears well-developed and well-nourished. No distress.  Cardiovascular: Normal rate and regular rhythm.   Pulmonary/Chest: Effort normal and breath sounds normal. No stridor. No respiratory distress. She has no wheezes. She has no rales.  Abdominal: Soft. Bowel sounds are normal. She exhibits no distension and no mass. There is no tenderness. There is no rebound and no guarding.  Neurological: She is alert and oriented to person, place, and time.  Skin: Skin is warm and dry. Large thick, red, plaque covered with silvery scales arcross her left shin with several smaller lesions across her right skin, bilateral knees, bilateral arms (most notably elbows, with a few scatter lesions on her face.    Assessment & Plan:   See Encounters Tab for problem based charting.  Patient discussed with Dr. Oswaldo DoneVincent

## 2016-03-17 NOTE — Assessment & Plan Note (Signed)
Was suppose to have colonsocpy after 5 years but has not been back in >12 years. Seen by [place].   Plan Referral to GI for colonoscopy

## 2016-03-18 ENCOUNTER — Encounter: Payer: Self-pay | Admitting: Physician Assistant

## 2016-03-18 NOTE — Progress Notes (Signed)
Internal Medicine Clinic Attending  Case discussed with Dr. Boswell at the time of the visit.  We reviewed the resident's history and exam and pertinent patient test results.  I agree with the assessment, diagnosis, and plan of care documented in the resident's note.  

## 2016-03-19 LAB — CBC
Hematocrit: 41.5 % (ref 34.0–46.6)
Hemoglobin: 13.7 g/dL (ref 11.1–15.9)
MCH: 30.8 pg (ref 26.6–33.0)
MCHC: 33 g/dL (ref 31.5–35.7)
MCV: 93 fL (ref 79–97)
PLATELETS: 280 10*3/uL (ref 150–379)
RBC: 4.45 x10E6/uL (ref 3.77–5.28)
RDW: 14 % (ref 12.3–15.4)
WBC: 5.5 10*3/uL (ref 3.4–10.8)

## 2016-03-19 LAB — LIPID PANEL
Chol/HDL Ratio: 7.2 ratio units — ABNORMAL HIGH (ref 0.0–4.4)
Cholesterol, Total: 244 mg/dL — ABNORMAL HIGH (ref 100–199)
HDL: 34 mg/dL — ABNORMAL LOW (ref >39)
LDL CALC: 159 mg/dL — AB (ref 0–99)
TRIGLYCERIDES: 255 mg/dL — AB (ref 0–149)
VLDL Cholesterol Cal: 51 mg/dL — ABNORMAL HIGH (ref 5–40)

## 2016-03-19 LAB — CMP14 + ANION GAP
ALBUMIN: 3.9 g/dL (ref 3.6–4.8)
ALK PHOS: 65 IU/L (ref 39–117)
ALT: 42 IU/L — AB (ref 0–32)
AST: 31 IU/L (ref 0–40)
Albumin/Globulin Ratio: 1.5 (ref 1.2–2.2)
Anion Gap: 17 mmol/L (ref 10.0–18.0)
BILIRUBIN TOTAL: 0.4 mg/dL (ref 0.0–1.2)
BUN / CREAT RATIO: 17 (ref 12–28)
BUN: 11 mg/dL (ref 8–27)
CHLORIDE: 101 mmol/L (ref 96–106)
CO2: 24 mmol/L (ref 18–29)
Calcium: 9.1 mg/dL (ref 8.7–10.3)
Creatinine, Ser: 0.65 mg/dL (ref 0.57–1.00)
GFR calc Af Amer: 111 mL/min/{1.73_m2} (ref >59)
GFR calc non Af Amer: 96 mL/min/{1.73_m2} (ref >59)
GLOBULIN, TOTAL: 2.6 g/dL (ref 1.5–4.5)
Glucose: 97 mg/dL (ref 65–99)
Potassium: 3.9 mmol/L (ref 3.5–5.2)
SODIUM: 142 mmol/L (ref 134–144)
Total Protein: 6.5 g/dL (ref 6.0–8.5)

## 2016-03-23 ENCOUNTER — Encounter: Payer: Self-pay | Admitting: Internal Medicine

## 2016-03-25 MED ORDER — PRAVASTATIN SODIUM 40 MG PO TABS: 40.0000 mg | ORAL_TABLET | Freq: Every evening | ORAL | 4 refills | Status: DC

## 2016-03-25 NOTE — Addendum Note (Signed)
Addended by: Hollie SalkBOSWELL, Javyon Fontan L on: 03/25/2016 09:38 AM   Modules accepted: Orders

## 2016-03-28 ENCOUNTER — Encounter: Payer: Self-pay | Admitting: Physician Assistant

## 2016-03-28 ENCOUNTER — Ambulatory Visit (INDEPENDENT_AMBULATORY_CARE_PROVIDER_SITE_OTHER): Payer: BLUE CROSS/BLUE SHIELD | Admitting: Physician Assistant

## 2016-03-28 VITALS — BP 116/68 | HR 72 | Ht 59.5 in | Wt 171.0 lb

## 2016-03-28 DIAGNOSIS — Z86010 Personal history of colonic polyps: Secondary | ICD-10-CM | POA: Diagnosis not present

## 2016-03-28 DIAGNOSIS — Z1212 Encounter for screening for malignant neoplasm of rectum: Secondary | ICD-10-CM

## 2016-03-28 DIAGNOSIS — Z1211 Encounter for screening for malignant neoplasm of colon: Secondary | ICD-10-CM

## 2016-03-28 DIAGNOSIS — R12 Heartburn: Secondary | ICD-10-CM

## 2016-03-28 DIAGNOSIS — K582 Mixed irritable bowel syndrome: Secondary | ICD-10-CM

## 2016-03-28 MED ORDER — HYOSCYAMINE SULFATE SL 0.125 MG SL SUBL: 0.1250 mg | SUBLINGUAL_TABLET | SUBLINGUAL | 3 refills | Status: AC | PRN

## 2016-03-28 MED ORDER — NA SULFATE-K SULFATE-MG SULF 17.5-3.13-1.6 GM/177ML PO SOLN: 1.0000 | ORAL | 0 refills | Status: DC

## 2016-03-28 MED ORDER — ONDANSETRON HCL 8 MG PO TABS: 8.0000 mg | ORAL_TABLET | ORAL | 0 refills | Status: AC

## 2016-03-28 NOTE — Progress Notes (Signed)
Assessment and plans reviewed and noted 

## 2016-03-28 NOTE — Progress Notes (Signed)
Chief Complaint: Consult for screening colonoscopy, heartburn, Abdominal spasms  HPI:  Tammy [name] is a 61 year old Caucasian female with a past medical history of chronic interstitial cystitis, reflux, ulcer, hiatal hernia, hypertension, IBS and polyps, as well as other history listed below, who was referred to me by Maryellen Pile, MD for consult for a screening colonoscopy as well as abdominal pain and heartburn.    Per chart review patient has been seen by Dr. Henrene Pastor in the past, she had an EGD completed 12/10/07 with findings of a hiatal hernia and a stricture in the distal esophagus. Patient also had a colonoscopy completed 09/12/05 which did have finding of polyps in the ascending colon. Repeat was recommended in 5 years.   Today, the patient presents to clinic accompanied by her daughter and tells me that she has a long history of stomach problems. Apparently she used to see Dr. Olevia Perches, we do not have those notes available. Most recently she has followed with Dr. Henrene Pastor for procedures as above. Patient tells me she has a long history of what she assumes is irritable bowel syndrome but "nobody has ever told me that". She does use Levsin on an occasional basis for what she describes as abdominal spasms. She also tells me that particular food sends her running to the toilet to have urgent diarrhea. She also describes lactose intolerance for which she uses Lactaid when necessary. It seems patient has most of these problems "under control". Patient also notes that she radiates back and forth from constipation to diarrhea depending on what she eats and has extreme bloating to certain foods.   Patient also describes today that she has had breakthrough heartburn and reflux over the past year due to the fact that she did not have health insurance and was off of her Pantoprazole 40 mg, she has recently restarted this and her symptoms have decreased over the past couple of weeks but she went for a year with acid  reflux, heartburn, belching and a lot of bloating as well as some occasional chest pain which she thinks was related to this.    Patient says a large amount of time today describing the fact that she cannot drink the prep. She tells me she can also not do the MiraLAX prep as she hates the taste of Gatorade. In the past she has prepped with "pills", but she knows we  "don't do that anymore". The patient asked for other ways in which to accomplish this prep.    Patient denies fever, chills, blood in her stool, melena, weight loss, anorexia, nausea, vomiting or symptoms that awaken her at night.  Past Medical History:  Diagnosis Date  . Allergic rhinitis, cause unspecified   . Carpal tunnel syndrome   . Chronic interstitial cystitis   . Colon polyps 03/07/2004  . Diaphragmatic hernia without mention of obstruction or gangrene   . Dysplasia of cervix, unspecified   . Esophageal reflux   . Essential hypertension, benign   . Gastric ulcer, unspecified as acute or chronic, without mention of hemorrhage, perforation, or obstruction   . Hiatal hernia   . Hypertension   . Insomnia, unspecified   . Irritable bowel syndrome   . Lung nodule 11/27/2011   CXR 11/14/2011-nodule versus osteophyte projecting over thoracic spine on lateral view CT chest 11/24/2011- confirms osteophyte with 2 small nodules right lower lobe and left lower lobe. CT 06/01/12- stable for 5 years.  No further follow-up necessary.   . Methicillin resistant Staphylococcus aureus  in conditions classified elsewhere and of unspecified site   . Mixed incontinence urge and stress (female)(female)   . Personal history of colonic polyps   . Pure hypercholesterolemia     Past Surgical History:  Procedure Laterality Date  . ABDOMINAL HYSTERECTOMY  03/07/2000   Myoma with abnormal uterine bleeding; benign pathology; OVARIES INTACT  . CARPAL TUNNEL RELEASE     Bilateral  . COLONOSCOPY  03/07/2004   colon polyps single.  [place].  . TONSILLECTOMY  AND ADENOIDECTOMY      Current Outpatient Prescriptions  Medication Sig Dispense Refill  . albuterol (PROVENTIL HFA;VENTOLIN HFA) 108 (90 BASE) MCG/ACT inhaler Inhale 2 puffs into the lungs every 6 (six) hours as needed.    Marland Kitchen albuterol (PROVENTIL) (2.5 MG/3ML) 0.083% nebulizer solution Take 2.5 mg by nebulization every 6 (six) hours as needed.    Marland Kitchen aspirin 81 MG tablet Take 81 mg by mouth daily.    Marland Kitchen b complex vitamins tablet Take 1 tablet by mouth daily.    . ferrous sulfate 325 (65 FE) MG tablet Take 325 mg by mouth daily with breakfast.    . hydrochlorothiazide (HYDRODIURIL) 25 MG tablet Take 1 tablet (25 mg total) by mouth daily. 30 tablet 0  . hydrocortisone cream 1 % Apply to affected area on face 2 times daily 30 g 1  . metoprolol tartrate (LOPRESSOR) 25 MG tablet take ONE-HALF TABLET BY MOUTH AS NEEDED (Patient taking differently: take ONE-HALF TABLET BY EVERY DAY AND AS NEEDED) 30 tablet 3  . Multiple Vitamin (MULTIVITAMIN) tablet Take 1 tablet by mouth daily.    . pantoprazole (PROTONIX) 40 MG tablet Take 1 tablet (40 mg total) by mouth daily. 30 tablet 1  . pravastatin (PRAVACHOL) 40 MG tablet Take 1 tablet (40 mg total) by mouth every evening. 90 tablet 4  . triamcinolone cream (KENALOG) 0.1 % Apply to lesions 2-4 times a day as needed to lesions. Do not apply to your face. 30 g 0  . Vitamin D, Cholecalciferol, 1000 UNITS TABS Take by mouth daily.    Marland Kitchen Hyoscyamine Sulfate SL (LEVSIN/SL) 0.125 MG SUBL Place 0.125 mg under the tongue every 4 (four) hours as needed. 30 each 3  . Na Sulfate-K Sulfate-Mg Sulf (SUPREP BOWEL PREP KIT) 17.5-3.13-1.6 GM/180ML SOLN Take 1 kit by mouth as directed. 324 mL 0  . ondansetron (ZOFRAN) 8 MG tablet Take 1 tablet (8 mg total) by mouth as directed. 10 tablet 0   No current facility-administered medications for this visit.     Allergies as of 03/28/2016 - Review Complete 03/28/2016  Allergen Reaction Noted  . Lisinopril Cough 07/20/2015  . Septra  ds [sulfamethoxazole-trimethoprim] Nausea Only 11/11/2011    Family History  Problem Relation Age of Onset  . Hyperlipidemia Mother   . Hypertension Mother   . Hypertension Father   . Coronary artery disease Father 74    stents  . Hyperlipidemia Father     Social History   Social History  . Marital status: Married    Spouse name: N/A  . Number of children: 2  . Years of education: N/A   Occupational History  . 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 Smoking status: Never Smoker  . Smokeless tobacco: Never Used  . Alcohol use 0.0 oz/week     Comment: Occassional use: <3 drinks/month  . Drug use: No  . Sexual activity: Yes    Partners:  Male   Other Topics Concern  . Not on file   Social History Narrative   Marital status:  Married since 1976; happy; no abuse. Husband on long term disability.       Lives with: husband; Pt's daughter and grandchild also live in the home.        Children:  2 children, 1 stepchild and 5 grandchildren.        Exercise:  None      Review of Systems:     Constitutional: No weight loss, fever or chills HEENT: Eyes: No change in vision               Ears, Nose, Throat:  Nose bleeds Skin: Itching Cardiovascular: Heart murmur and heart rhythm changes, swelling of feet and legs Respiratory: Shortness of breath Gastrointestinal: See HPI and otherwise negative Genitourinary: Blood in urine, urine leakage Neurological: No headache, dizziness or syncope Musculoskeletal: Arthritis and back pain Hematologic: No bleeding or bruising Psychiatric: Sleeping problems   Physical Exam:  Vital signs: BP 116/68   Pulse 72   Ht 4' 11.5" (1.511 m)   Wt 171 lb (77.6 kg)   BMI 33.96 kg/m   Constitutional: Overweight Caucasian female appears to be in NAD, Well developed, Well nourished, alert and cooperative Head:  Normocephalic and atraumatic. Eyes:   PEERL, EOMI. No  icterus. Conjunctiva pink. Ears:  Normal auditory acuity. Neck:  Supple Throat: Oral cavity and pharynx without inflammation, swelling or lesion.  Respiratory: Respirations even and unlabored. Lungs clear to auscultation bilaterally.   No wheezes, crackles, or rhonchi.  Cardiovascular: Normal S1, S2. No MRG. Regular rate and rhythm. No peripheral edema, cyanosis or pallor.  Gastrointestinal:  Soft, mild distention, mild generalized abdominal tenderness No rebound or guarding. Normal bowel sounds. No appreciable masses or hepatomegaly. Rectal:  Not performed.  Msk:  Symmetrical without gross deformities. Without edema, no deformity or joint abnormality.  Neurologic:  Alert and  oriented x4;  grossly normal neurologically.  Skin:   Dry and intact without significant lesions or rashes. Psychiatric: Demonstrates good judgement and reason without abnormal affect or behaviors.  RELEVANT LABS AND IMAGING: CBC    Component Value Date/Time   WBC 5.5 03/17/2016 1116   WBC 5.1 07/11/2014 1307   RBC 4.45 03/17/2016 1116   RBC 4.44 07/11/2014 1307   HGB 13.2 07/11/2014 1307   HCT 41.5 03/17/2016 1116   PLT 280 03/17/2016 1116   MCV 93 03/17/2016 1116   MCH 30.8 03/17/2016 1116   MCH 29.7 07/11/2014 1307   MCHC 33.0 03/17/2016 1116   MCHC 33.0 07/11/2014 1307   RDW 14.0 03/17/2016 1116   LYMPHSABS 1.8 07/11/2014 1307   MONOABS 0.4 07/11/2014 1307   EOSABS 0.2 07/11/2014 1307   BASOSABS 0.1 07/11/2014 1307    CMP     Component Value Date/Time   NA 142 03/17/2016 1116   K 3.9 03/17/2016 1116   CL 101 03/17/2016 1116   CO2 24 03/17/2016 1116   GLUCOSE 97 03/17/2016 1116   GLUCOSE 86 07/11/2014 1307   BUN 11 03/17/2016 1116   CREATININE 0.65 03/17/2016 1116   CREATININE 0.68 07/11/2014 1307   CALCIUM 9.1 03/17/2016 1116   PROT 6.5 03/17/2016 1116   ALBUMIN 3.9 03/17/2016 1116   AST 31 03/17/2016 1116   ALT 42 (H) 03/17/2016 1116   ALKPHOS 65 03/17/2016 1116   BILITOT 0.4  03/17/2016 1116   GFRNONAA 96 03/17/2016 1116   GFRAA 111 03/17/2016 1116  Assessment: 1. Screening colorectal cancer: Patient is overdue for screening colonoscopy due to her history of colon polyps on last colonoscopy in 2007 2. History of colonic polyps 3. IBS: Patient describes urgent diarrhea after eating certain foods, some lactose intolerance and abdominal cramping spasms which are relieved with Levsin, this sounds most likely like IBS, may need return office visit to discuss specific recommendations after time of colonoscopy 4. Heartburn: Breakthrough symptoms all of last year while not on medication, currently doing some better on pantoprazole 40 mg daily  Plan: 1. At this time recommend patient proceed with an EGD and colonoscopy. Discussed risks, benefits, limitations and alternatives and the patient agrees to proceed. This will be scheduled with Dr. Henrene Pastor in the [facility] 2. Offered to prescribed Reglan but the patient is scared of this medication due to side effects she has heard of and her husband experienced, she continues with this anxiety even after discussion, prescribed Zofran 8 mg 10-15 minutes before starting prep and every 4-6 hours as needed during prep 3. Will proceed with Movi-prep as this is a smaller amount 4. Refilled patient's Levsin every 4-6 hours as needed for abdominal spasms #30 with 3 refills 5. Patient should continue her Pantoprazole 40 mg daily, 30-60 minutes before eating breakfast 6. Patient to follow in clinic per Dr. Blanch Media recommendations after time of procedures or sooner if necessary  Tammy Newer, PA-C [facility] 03/28/2016, 1:17 PM  Cc: Maryellen Pile, MD

## 2016-03-28 NOTE — Patient Instructions (Addendum)
You have been scheduled for an endoscopy and colonoscopy. Please follow the written instructions given to you at your visit today. Please pick up your prep supplies at the pharmacy within the next 1-3 days. If you use inhalers (even only as needed), please bring them with you on the day of your procedure. Your physician has requested that you go to www.startemmi.com and enter the access code given to you at your visit today. This web site gives a general overview about your procedure. However, you should still follow specific instructions given to you by our office regarding your preparation for the procedure.  We have sent the following medications to your pharmacy for you to pick up at your convenience: Levsin 0.125 mg as needed Zofran 8 mg 15-6330mins before prep and 4-6 hrs as needed for nauseea

## 2016-04-07 DIAGNOSIS — R87618 Other abnormal cytological findings on specimens from cervix uteri: Secondary | ICD-10-CM

## 2016-04-07 HISTORY — DX: Other abnormal cytological findings on specimens from cervix uteri: R87.618

## 2016-04-08 ENCOUNTER — Encounter: Payer: BLUE CROSS/BLUE SHIELD | Admitting: Family Medicine

## 2016-04-19 ENCOUNTER — Ambulatory Visit (INDEPENDENT_AMBULATORY_CARE_PROVIDER_SITE_OTHER): Payer: BLUE CROSS/BLUE SHIELD | Admitting: Internal Medicine

## 2016-04-19 ENCOUNTER — Encounter: Payer: Self-pay | Admitting: Internal Medicine

## 2016-04-19 VITALS — BP 126/67 | HR 59 | Temp 97.8°F | Ht 59.9 in | Wt 172.1 lb

## 2016-04-19 DIAGNOSIS — G8929 Other chronic pain: Secondary | ICD-10-CM | POA: Diagnosis not present

## 2016-04-19 DIAGNOSIS — M545 Low back pain: Secondary | ICD-10-CM | POA: Diagnosis not present

## 2016-04-19 DIAGNOSIS — N879 Dysplasia of cervix uteri, unspecified: Secondary | ICD-10-CM | POA: Diagnosis not present

## 2016-04-19 DIAGNOSIS — R7401 Elevation of levels of liver transaminase levels: Secondary | ICD-10-CM

## 2016-04-19 DIAGNOSIS — L409 Psoriasis, unspecified: Secondary | ICD-10-CM

## 2016-04-19 DIAGNOSIS — R740 Nonspecific elevation of levels of transaminase and lactic acid dehydrogenase [LDH]: Secondary | ICD-10-CM | POA: Diagnosis not present

## 2016-04-19 NOTE — Patient Instructions (Signed)
Tammy [name],   I would like to see you back 1 month.

## 2016-04-19 NOTE — Progress Notes (Signed)
   CC: Back pain  HPI:  [name] is a 61 y.o. female with a past medical history listed below here today for follow up of her low back pain.  For details of today's visit and the status of her chronic medical issues please refer to the assessment and plan.  Past Medical History:  Diagnosis Date  . Allergic rhinitis, cause unspecified   . Carpal tunnel syndrome   . Chronic interstitial cystitis   . Colon polyps 03/07/2004  . Diaphragmatic hernia without mention of obstruction or gangrene   . Dysplasia of cervix, unspecified   . Esophageal reflux   . Essential hypertension, benign   . Gastric ulcer, unspecified as acute or chronic, without mention of hemorrhage, perforation, or obstruction   . Hiatal hernia   . Hypertension   . Insomnia, unspecified   . Irritable bowel syndrome   . Lung nodule 11/27/2011   CXR 11/14/2011-nodule versus osteophyte projecting over thoracic spine on lateral view CT chest 11/24/2011- confirms osteophyte with 2 small nodules right lower lobe and left lower lobe. CT 06/01/12- stable for 5 years.  No further follow-up necessary.   . Methicillin resistant Staphylococcus aureus in conditions classified elsewhere and of unspecified site   . Mixed incontinence urge and stress (female)(female)   . Personal history of colonic polyps   . Pure hypercholesterolemia     Review of Systems:   See HPI  Physical Exam:  Vitals:   04/19/16 1604  BP: 126/67  Pulse: (!) 59  Temp: 97.8 F (36.6 C)  TempSrc: Oral  SpO2: 100%  Weight: 172 lb 1.6 oz (78.1 kg)  Height: 4' 11.9" (1.521 m)   Physical Exam  Constitutional: She is well-developed, well-nourished, and in no distress. No distress.  Cardiovascular: Normal rate, regular rhythm and normal heart sounds.   Pulmonary/Chest: Effort normal and breath sounds normal.  Neurological: She has normal sensation, normal strength and normal reflexes. Gait normal.  Skin:  Multiple plaques with evidence of  excoriations and bleeding from scratching     Assessment & Plan:   See Encounters Tab for problem based charting.  Patient discussed with Dr. Cleda DaubE. Hoffman

## 2016-04-20 LAB — HEPATITIS B SURFACE ANTIGEN: Hepatitis B Surface Ag: NEGATIVE

## 2016-04-20 LAB — HEPATITIS B CORE ANTIBODY, TOTAL: Hep B Core Total Ab: NEGATIVE

## 2016-04-20 LAB — HEPATITIS B SURFACE ANTIBODY,QUALITATIVE: HEP B SURFACE AB, QUAL: NONREACTIVE

## 2016-04-21 ENCOUNTER — Telehealth: Payer: Self-pay | Admitting: Internal Medicine

## 2016-04-21 NOTE — Telephone Encounter (Signed)
Patient requesting can she have her labs drawn 1 wk prior to her appt 06/01/2016 with you.  Please Advise.

## 2016-04-25 DIAGNOSIS — R740 Nonspecific elevation of levels of transaminase and lactic acid dehydrogenase [LDH]: Secondary | ICD-10-CM

## 2016-04-25 DIAGNOSIS — M545 Low back pain, unspecified: Secondary | ICD-10-CM | POA: Insufficient documentation

## 2016-04-25 DIAGNOSIS — R7401 Elevation of levels of liver transaminase levels: Secondary | ICD-10-CM | POA: Insufficient documentation

## 2016-04-25 NOTE — Assessment & Plan Note (Signed)
Patient with history of abnormal PAP smear. Followed by Gyn who recommend annual PAP smear. She is unhappy with her care at her current gynecologist office and requesting referral to different practice.   Plan Will refer to OB/GYN

## 2016-04-25 NOTE — Assessment & Plan Note (Signed)
Reports she has been intermittent with using the tiamcinolone cream and hydrocortisone cream prescribed at last visit. Does indicate that her lesions improve when she does use the cream. Notes she has a visit with dermatology scheduled for tomorrow.   Assessment: psoriasis  Plan:  Continue triamcinolone (body) and hydrocortisone cream (face) as needed

## 2016-04-25 NOTE — Assessment & Plan Note (Signed)
Patient with mildly elevated ALT at last lab check. AST 31 and ALT 42. HCV checked previously was non reactive. She is not on any medications that would explain the elevation. She denies any symptoms today.   Assessment: Mild transaminitis  Plan:  Hep B studies negative with no indication of immunity or infection. Will need Hep B vaccination series.  Will repeat CMP at follow up visit, if remains elevated can pursue further work up.

## 2016-04-25 NOTE — Assessment & Plan Note (Signed)
Patient reports chronic low back pain. States symptoms have been worse over the past year. Does note weight gain over this time. Describes the pain and low mid back and aching and dull in nature. Worse when she is on her feet or physically active. Some improvement with NSAIDs and heat. Denies any shooting pains in her legs, leg weakness, numbness, loss of bowel or bladder control, saddle paresthesia.   Assessment: Chronic Low Back Pain  Plan:  This is most likely osteoarthritis given her age and her obesity. However, given her psoriasis other consideration would be psoriatic arthritis affecting her large joints. She does report significant arthritic pain in her bilateral hands. Will refer to rheum for further evaluation and recommendations. Continue conservative treatment given no alarm symptoms.

## 2016-04-26 NOTE — Telephone Encounter (Addendum)
LAT 6-29= 36 AST 10-35= 28 These were done 2/14 at dermatology, done at [facility], stated her office is faxing the results over to you She would like you to call her and discuss this and plan Ph [phone]

## 2016-04-26 NOTE — Telephone Encounter (Signed)
Will call tomorrow afternoon once I have a chance to review her records from dermatology

## 2016-04-27 NOTE — Telephone Encounter (Signed)
Discussed results with patient. No further work up at this time. Has follow up in a month scheduled.

## 2016-04-27 NOTE — Progress Notes (Signed)
Internal Medicine Clinic Attending  Case discussed with Dr. Boswell at the time of the visit.  We reviewed the resident's history and exam and pertinent patient test results.  I agree with the assessment, diagnosis, and plan of care documented in the resident's note.  

## 2016-05-03 ENCOUNTER — Ambulatory Visit (INDEPENDENT_AMBULATORY_CARE_PROVIDER_SITE_OTHER): Payer: BLUE CROSS/BLUE SHIELD | Admitting: Gynecology

## 2016-05-03 ENCOUNTER — Encounter: Payer: Self-pay | Admitting: Gynecology

## 2016-05-03 VITALS — BP 130/80 | Ht 59.0 in | Wt 173.0 lb

## 2016-05-03 DIAGNOSIS — Z01419 Encounter for gynecological examination (general) (routine) without abnormal findings: Secondary | ICD-10-CM | POA: Diagnosis not present

## 2016-05-03 DIAGNOSIS — Z87410 Personal history of cervical dysplasia: Secondary | ICD-10-CM

## 2016-05-03 DIAGNOSIS — L304 Erythema intertrigo: Secondary | ICD-10-CM

## 2016-05-03 DIAGNOSIS — Z780 Asymptomatic menopausal state: Secondary | ICD-10-CM | POA: Diagnosis not present

## 2016-05-03 NOTE — Progress Notes (Signed)
Tammy [name] Aug 03, 1955 [id number]   History:    61 y.o.  for annual gyn exam is a new patient to the practice who was referred to our practice by her PCP Dr. Gearldine Bienenstockhristy Smith. Patient has past history of abnormal Pap smears or she stated that in the 1980s [employer] she had an abnormal Pap smear resulting in colposcopy and biopsy for severe dysplasia in the GYN oncologist who treated this abnormality with laser. In the 90s also she had recurrence and was treated with laser treatment as well by [place]necologists [place] in [place]. Several years F that she had normal Pap smears and then once again she had abnormal Pap smear which we do not have the report in the early 2000 and she had a transvaginal hysterectomy with bilateral salpingo-oophorectomy which was also done for fibroids. Patient's never been on any hormonal replacement therapy. Her Pap smears that we have on record are as follows:  2013 normal 2014 normal 2015 ASCUS 2016 no Pap smear done 2017 normal  Patient had a colonoscopy in 2009 benign polyps were removed she's in the process of having a colonoscopy in the next couple weeks along with endoscopy because she suffers as well from gastroesophageal reflux. Patient has never had a bone density in the past.  Past medical history,surgical history, family history and social history were all reviewed and documented in the EPIC chart.  Gynecologic History No LMP recorded. Patient has had a hysterectomy. Contraception: post menopausal status Last Pap: See above. Results were: See above Last mammogram: 2017. Results were: normal  Obstetric History OB History  Gravida Para Term Preterm AB Living  2 2 2    0 2  SAB TAB Ectopic Multiple Live Births      0        # Outcome Date GA Lbr Len/2nd Weight Sex Delivery Anes PTL Lv  2 Term      Vag-Spont     1 Term      Vag-Spont          ROS: A ROS was performed and pertinent positives and negatives are included in the  history.  GENERAL: No fevers or chills. HEENT: No change in vision, no earache, sore throat or sinus congestion. NECK: No pain or stiffness. CARDIOVASCULAR: No chest pain or pressure. No palpitations. PULMONARY: No shortness of breath, cough or wheeze. GASTROINTESTINAL: No abdominal pain, nausea, vomiting or diarrhea, melena or bright red blood per rectum. GENITOURINARY: No urinary frequency, urgency, hesitancy or dysuria. MUSCULOSKELETAL: No joint or muscle pain, no back pain, no recent trauma. DERMATOLOGIC: No rash, no itching, no lesions. ENDOCRINE: No polyuria, polydipsia, no heat or cold intolerance. No recent change in weight. HEMATOLOGICAL: No anemia or easy bruising or bleeding. NEUROLOGIC: No headache, seizures, numbness, tingling or weakness. PSYCHIATRIC: No depression, no loss of interest in normal activity or change in sleep pattern.     Exam: chaperone present  BP 130/80   Ht 4\' 11"  (1.499 m)   Wt 173 lb (78.5 kg)   BMI 34.94 kg/m   Body mass index is 34.94 kg/m.  General appearance : Well developed well nourished female. No acute distress HEENT: Eyes: no retinal hemorrhage or exudates,  Neck supple, trachea midline, no carotid bruits, no thyroidmegaly Lungs: Clear to auscultation, no rhonchi or wheezes, or rib retractions  Heart: Regular rate and rhythm, no murmurs or gallops Breast:Examined in sitting and supine position were symmetrical in appearance, no palpable masses or tenderness,  no skin retraction,  no nipple inversion, no nipple discharge, intertrigo underneath right breast, no axillary or supraclavicular lymphadenopathy Abdomen: no palpable masses or tenderness, no rebound or guarding Extremities: no edema or skin discoloration or tenderness  Pelvic:  [place], Urethra, Skene Glands: Within normal limits             Vagina: No gross lesions or discharge  Cervix: Absent  Uterus  absent  Adnexa  Without masses or tenderness  Anus and perineum  normal    Rectovaginal  normal sphincter tone without palpated masses or tenderness             Hemoccult colonoscopy in 2 weeks.    Assessment/Plan:  61 y.o. female for annual exam with intertrigo underneath the right breast. She can buy over-the-counter antifungal cream to apply twice a day for 7-10 days. Pap smear was done today. We discussed the new guidelines. Patient to schedule her bone density study here in the office in the next couple weeks. Her PCP is been doing her blood work and her flu vaccine is up-to-date. We discussed importance of calcium vitamin D and weightbearing exercises for osteoporosis prevention.   Ok Edwards MD, 4:30 PM 05/03/2016

## 2016-05-03 NOTE — Patient Instructions (Signed)
Intertrigo Intertrigo is skin irritation (inflammation) that happens in warm, moist areas of the body. The irritation can cause a rash and make skin raw and itchy. The rash is usually pink or red. It happens mostly between folds of skin or where skin rubs together, such as:  Toes.  Armpits.  Groin.  Belly.  Breasts.  Buttocks. This condition is not passed from person to person (is not contagious). Follow these instructions at home:  Keep the affected area clean and dry.  Do not scratch your skin.  Stay cool as much as possible. Use an air conditioner or fan, if you can.  Apply over-the-counter and prescription medicines only as told by your doctor.  If you were prescribed an antibiotic medicine, use it as told by your doctor. Do not stop using the antibiotic even if your condition starts to get better.  Keep all follow-up visits as told by your doctor. This is important. How is this prevented?  Stay at a healthy weight.  Keep your feet dry. This is very important if you have diabetes. Wear cotton or wool socks.  Take care of and protect the skin in your groin and butt area as told by your doctor.  Do not wear tight clothes. Wear clothes that:  Are loose.  Take away moisture from your body.  Are made of cotton.  Wear a bra that gives good support, if needed.  Shower and dry yourself fully after being active.  Keep your blood sugar under control if you have diabetes. Contact a doctor if:  Your symptoms do not get better with treatment.  Your symptoms get worse or they spread.  You notice more redness and warmth.  You have a fever. This information is not intended to replace advice given to you by your health care provider. Make sure you discuss any questions you have with your health care provider. Document Released: 03/26/2010 Document Revised: 07/30/2015 Document Reviewed: 08/25/2014 Elsevier Interactive Patient Education  2017 Elsevier Inc.  

## 2016-05-04 ENCOUNTER — Encounter: Payer: Self-pay | Admitting: Internal Medicine

## 2016-05-05 ENCOUNTER — Encounter: Payer: Self-pay | Admitting: Gynecology

## 2016-05-05 LAB — HPV TYPE 16 AND 18/45 RNA
HPV TYPE 18/45 RNA: NOT DETECTED
HPV Type 16 RNA: NOT DETECTED

## 2016-05-05 LAB — PAP, TP IMAGING W/ HPV RNA, RFLX HPV TYPE 16,18/45: HPV MRNA, HIGH RISK: DETECTED — AB

## 2016-05-12 NOTE — Progress Notes (Deleted)
Tammy [name] Date of Birth: [birth year] Medical Record #[id number]  History of Present Illness: Tammy [name] is seen for followup HTN and PVCs. She has a history of PVCs and hypertension. She also has a history of atypical chest pain. She had a normal stress Myoview in 2009. In October 2012 she had a cardiac catheterization which was normal. Lisinopril was discontinued because of cough. She is not a good candidate for beta blockers because of a low resting heart rate.  She is also using Lasix as needed for swelling. She is following a low-sodium diet.  Current Outpatient Prescriptions on File Prior to Visit  Medication Sig Dispense Refill  . albuterol (PROVENTIL HFA;VENTOLIN HFA) 108 (90 BASE) MCG/ACT inhaler Inhale 2 puffs into the lungs every 6 (six) hours as needed.    Marland Kitchen albuterol (PROVENTIL) (2.5 MG/3ML) 0.083% nebulizer solution Take 2.5 mg by nebulization every 6 (six) hours as needed.    Marland Kitchen aspirin 81 MG tablet Take 81 mg by mouth daily.    Marland Kitchen b complex vitamins tablet Take 1 tablet by mouth daily.    . ferrous sulfate 325 (65 FE) MG tablet Take 325 mg by mouth daily with breakfast.    . hydrochlorothiazide (HYDRODIURIL) 25 MG tablet Take 1 tablet (25 mg total) by mouth daily. 30 tablet 0  . hydrocortisone cream 1 % Apply to affected area on face 2 times daily 30 g 1  . Hyoscyamine Sulfate SL (LEVSIN/SL) 0.125 MG SUBL Place 0.125 mg under the tongue every 4 (four) hours as needed. 30 each 3  . metoprolol tartrate (LOPRESSOR) 25 MG tablet take ONE-HALF TABLET BY MOUTH AS NEEDED (Patient taking differently: take ONE-HALF TABLET BY EVERY DAY AND AS NEEDED) 30 tablet 3  . Multiple Vitamin (MULTIVITAMIN) tablet Take 1 tablet by mouth daily.    . Na Sulfate-K Sulfate-Mg Sulf (SUPREP BOWEL PREP KIT) 17.5-3.13-1.6 GM/180ML SOLN Take 1 kit by mouth as directed. 324 mL 0  . ondansetron (ZOFRAN) 8 MG tablet Take 1 tablet (8 mg total) by mouth as directed. 10 tablet 0  . pantoprazole (PROTONIX) 40 MG  tablet Take 1 tablet (40 mg total) by mouth daily. 30 tablet 1  . pravastatin (PRAVACHOL) 40 MG tablet Take 1 tablet (40 mg total) by mouth every evening. 90 tablet 4  . triamcinolone cream (KENALOG) 0.1 % Apply to lesions 2-4 times a day as needed to lesions. Do not apply to your face. 30 g 0  . Vitamin D, Cholecalciferol, 1000 UNITS TABS Take by mouth daily.     No current facility-administered medications on file prior to visit.     Allergies  Allergen Reactions  . Lisinopril Cough  . Septra Ds [Sulfamethoxazole-Trimethoprim] Nausea Only    Past Medical History:  Diagnosis Date  . Abnormal Papanicolaou smear of cervix with positive human papilloma virus (HPV) test 04/2016  . Allergic rhinitis, cause unspecified   . Carpal tunnel syndrome   . Chronic interstitial cystitis   . Colon polyps 03/07/2004  . Diaphragmatic hernia without mention of obstruction or gangrene   . Dysplasia of cervix, unspecified   . Esophageal reflux   . Essential hypertension, benign   . Gastric ulcer, unspecified as acute or chronic, without mention of hemorrhage, perforation, or obstruction   . Hiatal hernia   . Hypertension   . Insomnia, unspecified   . Irritable bowel syndrome   . Lung nodule 11/27/2011   CXR 11/14/2011-nodule versus osteophyte projecting over thoracic spine on lateral view CT chest 11/24/2011-  confirms osteophyte with 2 small nodules right lower lobe and left lower lobe. CT 06/01/12- stable for 5 years.  No further follow-up necessary.   . Methicillin resistant Staphylococcus aureus in conditions classified elsewhere and of unspecified site   . Mixed incontinence urge and stress (female)(female)   . Personal history of colonic polyps   . Pure hypercholesterolemia     Past Surgical History:  Procedure Laterality Date  . ABDOMINAL HYSTERECTOMY  03/07/2000   Myoma with abnormal uterine bleeding; benign pathology; OVARIES INTACT  . CARPAL TUNNEL RELEASE     Bilateral  . COLONOSCOPY   03/07/2004   colon polyps single.  [place].  . TONSILLECTOMY AND ADENOIDECTOMY      History  Smoking Status  . Never Smoker  Smokeless Tobacco  . Never Used    History  Alcohol Use  . 0.0 oz/week    Comment: Occassional use: <3 drinks/month    Family History  Problem Relation Age of Onset  . Hyperlipidemia Mother   . Hypertension Mother   . Hypertension Father   . Coronary artery disease Father 26    stents  . Hyperlipidemia Father     Review of Systems: As noted in history of present illness.  All other systems were reviewed and are negative.  Physical Exam: There were no vitals taken for this visit. She is a pleasant, overweight white female in no acute distress. HEENT: Normal.  no JVD or bruits. Lungs: Clear Cardiovascular: Regular rate and rhythm, normal S1 and S2, no gallop or murmur. Abdomen: Soft and nontender. No masses or bruits. Bowel sounds are positive. Extremities: No cyanosis or edema. Pulses are 2+ and symmetric. Skin: Warm and dry Neuro: Alert and oriented x3. Cranial nerves II through XII are intact.  LABORATORY DATA: Lab Results  Component Value Date   WBC 5.5 03/17/2016   HGB 13.2 07/11/2014   HCT 41.5 03/17/2016   PLT 280 03/17/2016   GLUCOSE 97 03/17/2016   CHOL 244 (H) 03/17/2016   TRIG 255 (H) 03/17/2016   HDL 34 (L) 03/17/2016   LDLCALC 159 (H) 03/17/2016   ALT 42 (H) 03/17/2016   AST 31 03/17/2016   NA 142 03/17/2016   K 3.9 03/17/2016   CL 101 03/17/2016   CREATININE 0.65 03/17/2016   BUN 11 03/17/2016   CO2 24 03/17/2016   TSH 1.307 12/18/2013   INR 0.89 12/12/2010   HGBA1C 5.8 (H) 07/11/2014   ECG today demonstrates sinus bradycardia with a rate of 59 beats per minute. There is very minor nonspecific ST abnormality.  Assessment / Plan: 1. Hypertension-control is not optimal. I recommended increasing Cozaar to 100 mg daily. Continue low-sodium diet. Hopefully once some of her stressors resolve her blood pressure will  improve.  2. Atypical chest pain. Normal cardiac catheterization October 2012.  3. History of PVCs. Minimally symptomatic.

## 2016-05-13 ENCOUNTER — Ambulatory Visit: Payer: BLUE CROSS/BLUE SHIELD | Admitting: Cardiology

## 2016-05-18 ENCOUNTER — Encounter: Payer: BLUE CROSS/BLUE SHIELD | Admitting: Internal Medicine

## 2016-05-19 ENCOUNTER — Ambulatory Visit: Payer: BLUE CROSS/BLUE SHIELD | Admitting: Cardiology

## 2016-05-23 NOTE — Progress Notes (Signed)
Office Visit Note  Patient: Tammy [name]             Date of Birth: Jul 22, 1955           MRN: [id number]             PCP: Valentino Nose, MD Referring: Valentino Nose, MD Visit Date: 05/26/2016 Occupation: @GUAROCC @    Subjective:  Pain in multiple joints.   History of Present Illness: Tammy [name] is a 61 y.o. female seen in consultation per request of her PCP. According to patient she has had lower hip pain for multiple years. She recalls having dry skin patches on her scalp about 10 years ago which resolved by itself and no diagnosis was established. Over the last 5 years she has developed rash over her elbows and forearms which gradually spread to her both upper and lower extremities. She recently saw a dermatologist who did a skin biopsy and diagnosed it to be psoriasis. She was given a topical steroid cream which has helped her rash to some extent. She states her dermatologist tried to apply for Humira but it was not approved. She is unable to take methotrexate due to elevated LFTs. She states they'll LFT elevation is also new. Hepatitis panel was done which was negative. She's been experiencing increased pain and discomfort in her bilateral wrist joints, bilateral hands and she feels tightness in her hands. She has not seen visible swelling. She's been having discomfort in her lower back, bilateral hip joints, bilateral knee joints, bilateral feet. She states she has intermittent swelling in her toes. She also has history of frequent plantar fasciitis more so in the right than the left foot.  Activities of Daily Living:   Patient reports morning stiffness for 15 minutes   Patient Denies nocturnal pain.  Difficulty dressing/grooming: Denies Difficulty climbing stairs: Reports Difficulty getting out of chair: Reports Difficulty using hands for taps, buttons, cutlery, and/or writing: Reports   Review of Systems  Constitutional: Positive for fatigue. Negative for night  sweats, weight gain, weight loss and weakness.  HENT: Negative for mouth sores, trouble swallowing, trouble swallowing, mouth dryness and nose dryness.   Eyes: Negative for pain, redness, visual disturbance and dryness.  Respiratory: Positive for shortness of breath. Negative for cough and difficulty breathing.        On exertion  Cardiovascular: Negative for chest pain, palpitations, hypertension, irregular heartbeat and swelling in legs/feet.  Gastrointestinal: Positive for constipation and diarrhea.       History of IBS  Endocrine: Negative for increased urination.  Genitourinary: Negative for vaginal dryness.  Musculoskeletal: Positive for arthralgias, joint pain and morning stiffness. Negative for joint swelling, myalgias, muscle weakness, muscle tenderness and myalgias.  Skin: Positive for rash. Negative for color change, hair loss, skin tightness, ulcers and sensitivity to sunlight.       Psoriasis  Allergic/Immunologic: Negative for susceptible to infections.  Neurological: Negative for dizziness, memory loss and night sweats.  Hematological: Negative for swollen glands.  Psychiatric/Behavioral: Positive for depressed mood and sleep disturbance. The patient is nervous/anxious.     PMFS History:  Patient Active Problem List   Diagnosis Date Noted  . History of cervical dysplasia 05/03/2016  . Transaminitis 04/25/2016  . Low back pain 04/25/2016  . History of anemia 03/17/2016  . Health care maintenance 03/17/2016  . Psoriatic arthropathy (HCC) 07/20/2015  . Interstitial cystitis 07/20/2015  . Palpitations 08/13/2012  . Insomnia 08/13/2012  . Hyperlipidemia 08/13/2012  . Essential hypertension, benign  02/28/2012  . Cervical dysplasia 01/26/2012  . Colon polyp 01/26/2012  . Pulmonary nodule 11/27/2011  . Obstructive sleep apnea-question of 11/27/2011  . ESOPHAGEAL STRICTURE 12/05/2007  . GERD 12/05/2007    Past Medical History:  Diagnosis Date  . Abnormal Papanicolaou  smear of cervix with positive human papilloma virus (HPV) test 04/2016  . Allergic rhinitis, cause unspecified   . Carpal tunnel syndrome   . Chronic interstitial cystitis   . Colon polyps 03/07/2004  . Diaphragmatic hernia without mention of obstruction or gangrene   . Dysplasia of cervix, unspecified   . Esophageal reflux   . Essential hypertension, benign   . Gastric ulcer, unspecified as acute or chronic, without mention of hemorrhage, perforation, or obstruction   . Hiatal hernia   . Hypertension   . Insomnia, unspecified   . Irritable bowel syndrome   . Lung nodule 11/27/2011   CXR 11/14/2011-nodule versus osteophyte projecting over thoracic spine on lateral view CT chest 11/24/2011- confirms osteophyte with 2 small nodules right lower lobe and left lower lobe. CT 06/01/12- stable for 5 years.  No further follow-up necessary.   . Methicillin resistant Staphylococcus aureus in conditions classified elsewhere and of unspecified site   . Mixed incontinence urge and stress (female)(female)   . Personal history of colonic polyps   . Pure hypercholesterolemia     Family History  Problem Relation Age of Onset  . Hyperlipidemia Mother   . Hypertension Mother   . Hypertension Father   . Coronary artery disease Father 75    stents  . Hyperlipidemia Father    Past Surgical History:  Procedure Laterality Date  . ABDOMINAL HYSTERECTOMY  03/07/2000   Myoma with abnormal uterine bleeding; benign pathology; OVARIES INTACT  . CARPAL TUNNEL RELEASE     Bilateral  . COLONOSCOPY  03/07/2004   colon polyps single.  [place].  . TONSILLECTOMY AND ADENOIDECTOMY     Social History   Social History Narrative   Marital status:  Married since 1976; happy; no abuse. Husband on long term disability.       Lives with: husband; Pt's daughter and grandchild also live in the home.        Children:  2 children, 1 stepchild and 5 grandchildren.        Exercise:  None       Objective: Vital Signs: Resp 14    Ht 5' (1.524 m)   Wt 173 lb (78.5 kg)   BMI 33.79 kg/m    Physical Exam  Constitutional: She is oriented to person, place, and time. She appears well-developed and well-nourished.  HENT:  Head: Normocephalic and atraumatic.  Eyes: Conjunctivae and EOM are normal.  Neck: Normal range of motion.  Cardiovascular: Normal rate, regular rhythm, normal heart sounds and intact distal pulses.   Pulmonary/Chest: Effort normal and breath sounds normal.  Abdominal: Soft. Bowel sounds are normal.  Lymphadenopathy:    She has no cervical adenopathy.  Neurological: She is alert and oriented to person, place, and time.  Skin: Skin is warm and dry. Capillary refill takes less than 2 seconds. Rash noted.  Erythematous scales on bilateral upper extremities and lower extremities consistent with psoriasis. Psoriasis patches on back.  Psychiatric: She has a normal mood and affect. Her behavior is normal.  Nursing note and vitals reviewed.    Musculoskeletal Exam: C-spine and thoracic spine good range of motion she has limited painful range of motion of her lumbar spine. She had tenderness over bilateral SI joints.  Shoulder joints, elbow joints, wrist joints are "good range of motion. She is swelling over several of her PIP joints. Tenderness over her DIP joints was noted. She had painful range of motion of bilateral hip joints knee joints are good range of motion without discomfort, MTPs PIPs DIPs are good range of motion without discomfort, there was no evidence of callus tendinitis or plantar fasciitis.  CDAI Exam: No CDAI exam completed.    Investigation: Findings:  12/18/2013 hep C negative, 03/17/2016 CBC normal, CMP ALT 42, LDL 159, 04/19/2016 hepatitis B negative 04/20/16 CMP CBC normal, CMP ALT 36, hep C negative, TB gold negative  2015 CT chest: Showed bilateral pulmonary nodules which were stable from 2013    Imaging: Xr Hip Unilat W Or W/o Pelvis 2-3 Views Right  Result Date:  05/26/2016 Normal hip joint space was noted no SI joint changes were noted. Normal x-ray of the hip  Xr Hips Bilat W Or W/o Pelvis 2v  Result Date: 05/26/2016 Normal hip joint space was noted no SI joint changes were noted. Normal x-ray of the hip  Xr Foot 2 Views Left  Result Date: 05/26/2016 Left first MTP narrowing PIP/DIP narrowing without any erosive changes a small calcaneal spur was noted. No erosive changes were noted. 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 Multilevel spondylosis was noted. Mild L4-5 listhesis was noted. She has significant narrowing between L2-3 vertebrae. No syndesmophytes were noted. No SI joint sclerosis or narrowing was noted. Impression: Multilevel lumbar spondylosis   Speciality Comments: No specialty comments available.    Procedures:  No procedures performed Allergies: Lisinopril and Septra ds [sulfamethoxazole-trimethoprim]   Assessment / Plan:     Visit Diagnoses: Psoriasis: She has extensive psoriasis lesions on her extremities and her back. Few scattered lesions on the face were also needed noted.  Psoriatic arthritis: She  has active synovitis in her hands, sacroiliitis, a history of intermittent swelling in her feet and plantar fasciitis. Different treatment options and their side effects were discussed at length today. She's unable to take methotrexate due to elevated LFTs. She also has fear of needles. We will try to see if she can start [facility] .Handout was given and consent was taken. We will apply for otezla.  Pain in both hands -she has synovitis over her PIP joints on exam today. Plan: XR Hand 2 View Right, XR Hand 2 View Left. X-rays showed only mild osteoarthritic changes.  Pain of both hip joints -she had painful range of motion of bilateral hip joints today. She also has tenderness over SI joints. Plan: XR HIPS BILAT W OR W/O PELVIS 2V, XR HIP UNILAT W OR W/O PELVIS 2-3 VIEWS RIGHT. X-rays of bilateral hips were unremarkable.  Chronic bilateral low back pain without  sciatica - Plan: XR Lumbar Spine 2-3 Views x-rays reveals multilevel spondylosis.  Pain in both feet -she has intermittent swelling of bilateral feet but no synovitis noted today. Plan: XR Foot 2 Views Right, XR Foot 2 Views Left. X-rays revealed mild osteoarthritic changes.  Transaminitis her ALT is mildly elevated without any etiology. Appetite is B&C panels were negative.  Hyperlipidemia  Essential hypertension  Gastroesophageal reflux disease with esophagitis  Interstitial cystitis    Orders: Orders Placed This Encounter  Procedures  . XR Hand 2 View Right  . XR Hand 2 View Left  . XR HIPS BILAT W OR W/O PELVIS 2V  . XR HIP UNILAT W OR W/O PELVIS 2-3 VIEWS RIGHT  . XR Lumbar Spine 2-3 Views  . XR Foot 2 Views Right  . XR Foot 2 Views Left  . Sedimentation rate  . CK  . TSH  . ANA  . Serum protein electrophoresis with reflex  . IgG, IgA, IgM  . HIV antibody   No orders of the defined types were placed in this encounter.   Face-to-face time spent with patient was 50 minutes. 50% of time was spent in counseling and  coordination of care.  Follow-Up Instructions: Return for Psoriasis.   Pollyann Savoy, MD  Note - This record has been created using Animal nutritionist.  Chart creation errors have been sought, but may not always  have been located. Such creation errors do not reflect on  the standard of medical care.

## 2016-05-26 ENCOUNTER — Ambulatory Visit (INDEPENDENT_AMBULATORY_CARE_PROVIDER_SITE_OTHER): Payer: BLUE CROSS/BLUE SHIELD

## 2016-05-26 ENCOUNTER — Encounter: Payer: Self-pay | Admitting: Rheumatology

## 2016-05-26 ENCOUNTER — Ambulatory Visit (INDEPENDENT_AMBULATORY_CARE_PROVIDER_SITE_OTHER): Payer: Self-pay

## 2016-05-26 ENCOUNTER — Ambulatory Visit (INDEPENDENT_AMBULATORY_CARE_PROVIDER_SITE_OTHER): Payer: BLUE CROSS/BLUE SHIELD | Admitting: Rheumatology

## 2016-05-26 VITALS — Resp 14 | Ht 60.0 in | Wt 173.0 lb

## 2016-05-26 DIAGNOSIS — R911 Solitary pulmonary nodule: Secondary | ICD-10-CM

## 2016-05-26 DIAGNOSIS — N3010 Interstitial cystitis (chronic) without hematuria: Secondary | ICD-10-CM

## 2016-05-26 DIAGNOSIS — M25552 Pain in left hip: Secondary | ICD-10-CM

## 2016-05-26 DIAGNOSIS — K2100 Gastro-esophageal reflux disease with esophagitis, without bleeding: Secondary | ICD-10-CM

## 2016-05-26 DIAGNOSIS — M25551 Pain in right hip: Secondary | ICD-10-CM

## 2016-05-26 DIAGNOSIS — M79642 Pain in left hand: Secondary | ICD-10-CM

## 2016-05-26 DIAGNOSIS — M79671 Pain in right foot: Secondary | ICD-10-CM

## 2016-05-26 DIAGNOSIS — Z79899 Other long term (current) drug therapy: Secondary | ICD-10-CM

## 2016-05-26 DIAGNOSIS — R5383 Other fatigue: Secondary | ICD-10-CM | POA: Diagnosis not present

## 2016-05-26 DIAGNOSIS — I10 Essential (primary) hypertension: Secondary | ICD-10-CM | POA: Diagnosis not present

## 2016-05-26 DIAGNOSIS — M545 Low back pain, unspecified: Secondary | ICD-10-CM

## 2016-05-26 DIAGNOSIS — M79641 Pain in right hand: Secondary | ICD-10-CM

## 2016-05-26 DIAGNOSIS — G8929 Other chronic pain: Secondary | ICD-10-CM | POA: Diagnosis not present

## 2016-05-26 DIAGNOSIS — M79672 Pain in left foot: Secondary | ICD-10-CM

## 2016-05-26 DIAGNOSIS — R740 Nonspecific elevation of levels of transaminase and lactic acid dehydrogenase [LDH]: Secondary | ICD-10-CM

## 2016-05-26 DIAGNOSIS — L4050 Arthropathic psoriasis, unspecified: Secondary | ICD-10-CM

## 2016-05-26 DIAGNOSIS — E785 Hyperlipidemia, unspecified: Secondary | ICD-10-CM | POA: Diagnosis not present

## 2016-05-26 DIAGNOSIS — R7401 Elevation of levels of liver transaminase levels: Secondary | ICD-10-CM

## 2016-05-26 DIAGNOSIS — L409 Psoriasis, unspecified: Secondary | ICD-10-CM | POA: Diagnosis not present

## 2016-05-26 LAB — TSH: TSH: 0.83 m[IU]/L

## 2016-05-26 NOTE — Patient Instructions (Signed)
Apremilast oral tablets What is this medicine? APREMILAST (a PRE mil ast) is used to treat plaque psoriasis and psoriatic arthritis. This medicine may be used for other purposes; ask your health care provider or pharmacist if you have questions. COMMON BRAND NAME(S): Otezla What should I tell my health care provider before I take this medicine? They need to know if you have any of these conditions: -dehydration -kidney disease -mental illness -an unusual or allergic reaction to apremilast, other medicines, foods, dyes, or preservatives -pregnant or trying to get pregnant -breast-feeding How should I use this medicine? Take this medicine by mouth with a glass of water. Follow the directions on the prescription label. Do not cut, crush or chew this medicine. You can take it with or without food. If it upsets your stomach, take it with food. Take your medicine at regular intervals. Do not take it more often than directed. Do not stop taking except on your doctor's advice. Talk to your pediatrician regarding the use of this medicine in children. Special care may be needed. Overdosage: If you think you have taken too much of this medicine contact a poison control center or emergency room at once. NOTE: This medicine is only for you. Do not share this medicine with others. What if I miss a dose? If you miss a dose, take it as soon as you can. If it is almost time for your next dose, take only that dose. Do not take double or extra doses. What may interact with this medicine? This medicine may interact with the following medications: -certain medicines for seizures like carbamazepine, phenobarbital, phenytoin -rifampin This list may not describe all possible interactions. Give your health care provider a list of all the medicines, herbs, non-prescription drugs, or dietary supplements you use. Also tell them if you smoke, drink alcohol, or use illegal drugs. Some items may interact with your  medicine. What should I watch for while using this medicine? Tell your doctor or healthcare professional if your symptoms do not start to get better or if they get worse. Patients and their families should watch out for new or worsening depression or thoughts of suicide. Also watch out for sudden changes in feelings such as feeling anxious, agitated, panicky, irritable, hostile, aggressive, impulsive, severely restless, overly excited and hyperactive, or not being able to sleep. If this happens, call your health care professional. Check with your doctor or health care professional if you get an attack of severe diarrhea, nausea and vomiting, or if you sweat a lot. The loss of too much body fluid can make it dangerous for you to take this medicine. What side effects may I notice from receiving this medicine? Side effects that you should report to your doctor or health care professional as soon as possible: -depressed mood -weight loss Side effects that usually do not require medical attention (report to your doctor or health care professional if they continue or are bothersome): -diarrhea -headache -nausea, vomiting This list may not describe all possible side effects. Call your doctor for medical advice about side effects. You may report side effects to FDA at 1-800-FDA-1088. Where should I keep my medicine? Keep out of the reach of children. Store below 30 degrees C (86 degrees F). Throw away any unused medicine after the expiration date. NOTE: This sheet is a summary. It may not cover all possible information. If you have questions about this medicine, talk to your doctor, pharmacist, or health care provider.  2018 Elsevier/Gold Standard (2015-09-09 10:55:44)  

## 2016-05-26 NOTE — Progress Notes (Signed)
Pharmacy Note  Subjective: Patient presents today to the [facility] to see Dr. Corliss Skainseveshwar.  Patient was prescribed Otezla for psoriasis and psoriatic arthritis.  Patient was seen by the pharmacist for counseling on high risk medication.  Objective: 04/20/16 CMP normal except for ALT 36  Assessment/Plan:  Counseled patient that Henderson BaltimoreOtezla is a PDE 4 inhibitor that works to treat psoriasis and the joint pain and tenderness of psoriatic arthritis.  Counseled patient on purpose, proper use, and adverse effects of Otezla.  Reviewed the most common adverse effects of weight loss, depression, nausea/diarrhea/vomiting, headaches, and nasal congestion.  Provided patient with medication education material and answered all questions.  Patient consented to [place].  Submitted PA for [place]la through [employer] insurance.    Lilla Shookachel Henderson, Pharm.D., BCPS, CPP Clinical Pharmacist Pager: [phone] Phone: [phone] 05/26/2016 4:14 PM

## 2016-05-27 LAB — IGG, IGA, IGM
IGG (IMMUNOGLOBIN G), SERUM: 1142 mg/dL (ref 694–1618)
IgA: 211 mg/dL (ref 81–463)
IgM, Serum: 95 mg/dL (ref 48–271)

## 2016-05-27 LAB — ANA: ANA: NEGATIVE

## 2016-05-27 LAB — CK: Total CK: 53 U/L (ref 7–177)

## 2016-05-27 LAB — HIV ANTIBODY (ROUTINE TESTING W REFLEX): HIV 1&2 Ab, 4th Generation: NONREACTIVE

## 2016-05-27 LAB — SEDIMENTATION RATE: Sed Rate: 7 mm/hr (ref 0–30)

## 2016-05-30 ENCOUNTER — Telehealth: Payer: Self-pay | Admitting: Pharmacist

## 2016-05-30 LAB — PROTEIN ELECTROPHORESIS, SERUM, WITH REFLEX
Albumin ELP: 3.8 g/dL (ref 3.8–4.8)
Alpha-1-Globulin: 0.3 g/dL (ref 0.2–0.3)
Alpha-2-Globulin: 0.7 g/dL (ref 0.5–0.9)
BETA 2: 0.4 g/dL (ref 0.2–0.5)
BETA GLOBULIN: 0.4 g/dL (ref 0.4–0.6)
Gamma Globulin: 1.1 g/dL (ref 0.8–1.7)
TOTAL PROTEIN, SERUM ELECTROPHOR: 6.6 g/dL (ref 6.1–8.1)

## 2016-05-30 MED ORDER — APREMILAST 30 MG PO TABS: 30.0000 mg | ORAL_TABLET | Freq: Two times a day (BID) | ORAL | 2 refills | Status: DC

## 2016-05-30 NOTE — Progress Notes (Signed)
WNL

## 2016-05-30 NOTE — Telephone Encounter (Addendum)
Patient's Tammy Pharris [name] was approved through her insurance company (reference # [id number], effective dates: 05/26/16 - 03/06/2038).    I called patient to inform her.  Also advised that labs were normal.  Advised that we can provide starter pack and send in maintenance prescription.  Medication Samples have been set aside for the patient.  Drug name: Tammy [name], Strength: 10, 20, 30 mg Starter Pack, Qty: 1 pack, LOT: [id number], Exp.Date: 08/2016  Dosing instructions: Take as instructed per package instructions.    The patient has been instructed regarding the correct time, dose, and frequency of taking this medication, including desired effects and most common side effects.   Dr. Corliss Skainseveshwar, during phone call patient reports pain in hands over the weekend including stiffness.  She reports she has been taking naproxen OTC 2-3 times daily as needed for pain, but it is only providing partial relief.  She was asking if anything else can be given for pain?  Do you think patient would benefit from prednisone taper while starting Otezla.  Please advise.    Tammy [name], Pharm.D., BCPS, CPP Clinical Pharmacist Pager: [phone] Phone: [phone] 05/30/2016 4:20 PM

## 2016-05-30 NOTE — Telephone Encounter (Signed)
yes, she will benefit from prednisone taper. Please discussed side effects and if she is in agreement that we can give her prednisone 5 mg tablets, 20 mg for one week, 15 mg for one week, 10 mg for 1 week, 5 mg for 1 week, 2.5mg  g for 1 week.

## 2016-05-31 ENCOUNTER — Telehealth: Payer: Self-pay | Admitting: Internal Medicine

## 2016-05-31 MED ORDER — PREDNISONE 5 MG PO TABS: ORAL_TABLET | ORAL | 0 refills | Status: DC

## 2016-05-31 NOTE — Telephone Encounter (Signed)
APT. REMINDER CALL, LMTCB °

## 2016-05-31 NOTE — Telephone Encounter (Signed)
I spoke to patient regarding prednisone.  Counseled her on the purpose, proper use, and adverse effects of prednisone including the need to take in the morning with food.  Advised patient to monitor her blood pressure and blood sugar closely.  Advised patient to stop taking naproxen while on prednisone.  Patient voiced understanding and denies any questions at this time.     Lilla Shookachel Henderson, Pharm.D., BCPS, CPP Clinical Pharmacist Pager: [phone] Phone: [phone] 05/31/2016 8:34 AM

## 2016-06-01 ENCOUNTER — Ambulatory Visit (INDEPENDENT_AMBULATORY_CARE_PROVIDER_SITE_OTHER): Payer: BLUE CROSS/BLUE SHIELD | Admitting: Internal Medicine

## 2016-06-01 ENCOUNTER — Encounter: Payer: Self-pay | Admitting: Internal Medicine

## 2016-06-01 ENCOUNTER — Ambulatory Visit (HOSPITAL_COMMUNITY)
Admission: RE | Admit: 2016-06-01 | Discharge: 2016-06-01 | Disposition: A | Discharge status: Home / Self Care | Payer: BLUE CROSS/BLUE SHIELD | Source: Ambulatory Visit | Attending: Internal Medicine | Admitting: Internal Medicine

## 2016-06-01 ENCOUNTER — Other Ambulatory Visit: Payer: Self-pay | Admitting: Internal Medicine

## 2016-06-01 VITALS — BP 144/66 | HR 59 | Temp 98.1°F | Ht 60.0 in | Wt 174.8 lb

## 2016-06-01 DIAGNOSIS — R002 Palpitations: Secondary | ICD-10-CM | POA: Diagnosis not present

## 2016-06-01 DIAGNOSIS — R0789 Other chest pain: Secondary | ICD-10-CM | POA: Diagnosis not present

## 2016-06-01 DIAGNOSIS — Z23 Encounter for immunization: Secondary | ICD-10-CM | POA: Diagnosis not present

## 2016-06-01 DIAGNOSIS — R079 Chest pain, unspecified: Secondary | ICD-10-CM | POA: Diagnosis present

## 2016-06-01 DIAGNOSIS — L4050 Arthropathic psoriasis, unspecified: Secondary | ICD-10-CM

## 2016-06-01 DIAGNOSIS — E785 Hyperlipidemia, unspecified: Secondary | ICD-10-CM | POA: Diagnosis not present

## 2016-06-01 DIAGNOSIS — R7303 Prediabetes: Secondary | ICD-10-CM

## 2016-06-01 DIAGNOSIS — Z79899 Other long term (current) drug therapy: Secondary | ICD-10-CM

## 2016-06-01 DIAGNOSIS — I10 Essential (primary) hypertension: Secondary | ICD-10-CM

## 2016-06-01 DIAGNOSIS — R001 Bradycardia, unspecified: Secondary | ICD-10-CM | POA: Diagnosis not present

## 2016-06-01 LAB — GLUCOSE, CAPILLARY: Glucose-Capillary: 92 mg/dL (ref 65–99)

## 2016-06-01 LAB — POCT GLYCOSYLATED HEMOGLOBIN (HGB A1C): HEMOGLOBIN A1C: 5.5

## 2016-06-01 MED ORDER — NAPROXEN 500 MG PO TABS: 500.0000 mg | ORAL_TABLET | Freq: Two times a day (BID) | ORAL | 2 refills | Status: DC

## 2016-06-01 MED ORDER — LOSARTAN POTASSIUM 25 MG PO TABS: 25.0000 mg | ORAL_TABLET | Freq: Every day | ORAL | 2 refills | Status: DC

## 2016-06-01 MED ORDER — NAPROXEN 500 MG PO TABS: 500.0000 mg | ORAL_TABLET | Freq: Two times a day (BID) | ORAL | 2 refills | Status: AC

## 2016-06-01 NOTE — Assessment & Plan Note (Addendum)
Patient was seen by Dr. Corliss Skainseveshwar (Rhuematology) no 3/22. Due to her elevated LFTs she is not a candidate for methotrexate. She is currently in the process of being approved for Otezla (Apremilast). Multiple XR were done at that visit including her bilateral hands which only showed mild OA changes. Her bilateral hip XR were unremarkable. XR lumbar spine showed multiple level spondylosis. XR of her bilateral feet showed mild OA changes.   Her Hepatitis B panel showed no immunity with no evidence of cleared infection or vaccination.   Assessment: Psoriatic arthritis  Plan: Start hepatitis B vaccination schedule today Naproxen 500 mg bid pr

## 2016-06-01 NOTE — Progress Notes (Signed)
When pt removed shirt for injections it was noted that she has an area in R antecubital area that is bruised, skin surrounding pinker than other skin appr 10cm area arround, warmer to touch than other and 2cm area of firmness, states it is slightly painful, states she had blood drawn at ortho office last week and it has been like this since, she was advised to call ortho office tomorrow and go by to have them eval, she is ask to watch for pink area to turn red, skin become hot, drainage, increased firmness and swelling, also painful, if she has these to happen seek advice at ED, urg care or imc. She was agreeable

## 2016-06-01 NOTE — Progress Notes (Signed)
   CC: Psoriasis  HPI:  Tammy [name] is a 61 y.o. female with a past medical history listed below here today for follow up of her psoriasis.  For details of today's visit and the status of her chronic medical issues please refer to the assessment and plan.   Past Medical History:  Diagnosis Date  . Abnormal Papanicolaou smear of cervix with positive human papilloma virus (HPV) test 04/2016  . Allergic rhinitis, cause unspecified   . Carpal tunnel syndrome   . Chronic interstitial cystitis   . Colon polyps 03/07/2004  . Diaphragmatic hernia without mention of obstruction or gangrene   . Dysplasia of cervix, unspecified   . Esophageal reflux   . Essential hypertension, benign   . Gastric ulcer, unspecified as acute or chronic, without mention of hemorrhage, perforation, or obstruction   . Hiatal hernia   . Hypertension   . Insomnia, unspecified   . Irritable bowel syndrome   . Lung nodule 11/27/2011   CXR 11/14/2011-nodule versus osteophyte projecting over thoracic spine on lateral view CT chest 11/24/2011- confirms osteophyte with 2 small nodules right lower lobe and left lower lobe. CT 06/01/12- stable for 5 years.  No further follow-up necessary.   . Methicillin resistant Staphylococcus aureus in conditions classified elsewhere and of unspecified site   . Mixed incontinence urge and stress (female)(female)   . Personal history of colonic polyps   . Pure hypercholesterolemia     Review of Systems:   See HPI  Physical Exam:  Vitals:   06/01/16 1600  BP: (!) 144/66  Pulse: (!) 59  Temp: 98.1 F (36.7 C)  TempSrc: Oral  SpO2: 97%  Weight: 174 lb 12.8 oz (79.3 kg)  Height: 5' (1.524 m)   Physical Exam  Constitutional: She is well-developed, well-nourished, and in no distress.  Cardiovascular: Normal rate and regular rhythm.   Pulmonary/Chest: Effort normal and breath sounds normal.  Skin: Skin is warm and dry. Rash noted.     Assessment & Plan:   See  Encounters Tab for problem based charting.  Patient discussed with Dr. Oswaldo DoneVincent

## 2016-06-01 NOTE — Patient Instructions (Addendum)
Ms. Neale BurlyFreeman,  I am going to start you back on the Losartan but at a low dose, 25 mg daily. I am also going to send in a prescription for the Naproxen 500 mg twice a day.   For your heart palpitations, I would like to get you set up to have a 30 day monitor to see if we can't catch anything.   I would like to see you back in a month to recheck on you.

## 2016-06-01 NOTE — Assessment & Plan Note (Addendum)
BP Readings from Last 3 Encounters:  06/01/16 (!) 144/66  05/03/16 130/80  04/19/16 126/67    Lab Results  Component Value Date   NA 142 03/17/2016   K 3.9 03/17/2016   CREATININE 0.65 03/17/2016   Blood pressure today is 144/66. She is currently on HCTZ 25 mg daily and metoprolol 12.5 mg prn palpitations. Has been on lisinopril in the past but was stopped due to cough. Most recent lab work 03/2016 with no electrolyte abnormalities. Denies any headaches, vision changes today. Does report BP in the 160s at home at times.  Assessment: HTN  Plan: Will start Losartan 25 mg daily today

## 2016-06-01 NOTE — Assessment & Plan Note (Addendum)
Lab Results  Component Value Date   HGBA1C 5.5 06/01/2016   HGBA1C 5.8 (H) 07/11/2014   HGBA1C 5.8 (H) 12/18/2013    [name] has a history of pre-diabetes. Last HgbA1c was 5.8 on 07/2014. Denies any polyuria or polydipsia today.   A1c improved today at 5.5.  Assessment: Pre-DM  Plan: Continue to monitor annually

## 2016-06-02 DIAGNOSIS — R079 Chest pain, unspecified: Secondary | ICD-10-CM | POA: Insufficient documentation

## 2016-06-02 NOTE — Assessment & Plan Note (Signed)
Tammy [name] has complaints of chest pain today. Reports substernal chest pain that is non-radiating. Denies any tightness or pressure and reports more sharp pain. No shortness of breath, nausea, jaw pain. Does report numbness down left arm. She does have risk factors including HTN, pre-DM, HLD.   EKG today with NSR and no acute changes.   Assessment: Atypical Chest Pain  Plan: EKG today reassuring with no acute changes. Unclear etiology but low suspicion for cardiac etiology at this time. Patient already has appointment with cardiology scheduled for next week.

## 2016-06-02 NOTE — Assessment & Plan Note (Signed)
She reports that she gets palpitations every week to two weeks. States she has previously had a 24 hour monitor that did not reveal anything. Reports associated coughing and shortness of breath with the palpitations.   Assessment: Palpitations  Plan: Continue metoprolol prn Schedule 30 day monitor

## 2016-06-06 ENCOUNTER — Telehealth: Payer: Self-pay

## 2016-06-06 NOTE — Telephone Encounter (Signed)
Return pt's call - no answer; left message to give us a call back. 

## 2016-06-06 NOTE — Telephone Encounter (Signed)
Questions about med. Please call back.  

## 2016-06-06 NOTE — Progress Notes (Signed)
Internal Medicine Clinic Attending  Case discussed with Dr. Boswell at the time of the visit.  We reviewed the resident's history and exam and pertinent patient test results.  I agree with the assessment, diagnosis, and plan of care documented in the resident's note.  

## 2016-06-08 ENCOUNTER — Telehealth: Payer: Self-pay | Admitting: Rheumatology

## 2016-06-08 NOTE — Telephone Encounter (Signed)
I spoke to patient regarding side effects of Otezla.  She reports that she was able to control the diarrhea with Immodium; however, she could not tolerate the medication due to the nausea.  She confirms she does have ondansetron at home and she took some for the nausea, but the nausea did not resolve.  Patient does not wish to continue the medication.   Briefly discussed Harriette Ohara and Stelara.  Patient is more inclined to start Stelara due to risk of elevated hepatic function and cholesterol with Harriette Ohara.  She will read about Stelara.  Patient is scheduled for follow up 06/30/16.  Advised patient to call the office tomorrow to see if there is a follow up visit available sooner than 06/30/16 to discuss changing medications.     Lilla Shook, Pharm.D., BCPS, CPP Clinical Pharmacist Pager: [phone] Phone: [phone] 06/08/2016 5:11 PM

## 2016-06-08 NOTE — Telephone Encounter (Signed)
Patient left a message yesterday stating that she is having some side effects to the [place]. CB#[phone].  Thank you.

## 2016-06-08 NOTE — Telephone Encounter (Signed)
Please call and discuss the side effects of otezla. May be she can continue the medication along with Imodium and Zofran. If she cannot tolerate otezla forever then we can consider [place] or Stelara.

## 2016-06-08 NOTE — Telephone Encounter (Signed)
Attempted to contact the patient and left message for patient to call the office.  

## 2016-06-08 NOTE — Telephone Encounter (Signed)
Patient state she has stopped taking the [place]. Patient states states she started having GI issue. She had nausea and diarrhea. Patient states she felt terrible and nauseous. Patient states she had taking 6 doses of the medication. Patient states she is not going to continue the medication because of these nausea side effect.

## 2016-06-09 ENCOUNTER — Other Ambulatory Visit: Payer: Self-pay | Admitting: *Deleted

## 2016-06-09 ENCOUNTER — Ambulatory Visit: Payer: BLUE CROSS/BLUE SHIELD | Admitting: Cardiology

## 2016-06-09 NOTE — Telephone Encounter (Signed)
error 

## 2016-06-16 ENCOUNTER — Telehealth: Payer: Self-pay | Admitting: Internal Medicine

## 2016-06-16 ENCOUNTER — Encounter: Payer: Self-pay | Admitting: Rheumatology

## 2016-06-16 ENCOUNTER — Ambulatory Visit (INDEPENDENT_AMBULATORY_CARE_PROVIDER_SITE_OTHER): Payer: BLUE CROSS/BLUE SHIELD | Admitting: Rheumatology

## 2016-06-16 VITALS — BP 120/70 | HR 70 | Resp 14 | Wt 172.0 lb

## 2016-06-16 DIAGNOSIS — L4050 Arthropathic psoriasis, unspecified: Secondary | ICD-10-CM

## 2016-06-16 DIAGNOSIS — L409 Psoriasis, unspecified: Secondary | ICD-10-CM

## 2016-06-16 DIAGNOSIS — Z79899 Other long term (current) drug therapy: Secondary | ICD-10-CM | POA: Diagnosis not present

## 2016-06-16 DIAGNOSIS — Z862 Personal history of diseases of the blood and blood-forming organs and certain disorders involving the immune mechanism: Secondary | ICD-10-CM

## 2016-06-16 DIAGNOSIS — F5101 Primary insomnia: Secondary | ICD-10-CM

## 2016-06-16 DIAGNOSIS — N3010 Interstitial cystitis (chronic) without hematuria: Secondary | ICD-10-CM

## 2016-06-16 DIAGNOSIS — Z8639 Personal history of other endocrine, nutritional and metabolic disease: Secondary | ICD-10-CM

## 2016-06-16 NOTE — Telephone Encounter (Signed)
Its okay to proceed.

## 2016-06-16 NOTE — Progress Notes (Signed)
Pharmacy Note  Subjective: Patient presents today to the [facility] to see Dr. Corliss Skains.   Patient seen by the pharmacist for counseling on Stelara.    Objective: TB Test: negative (04/20/16) Hepatitis panel: hepatitis C negative 12/18/2013, hepatitis B negative 04/19/16 HIV: negative (05/26/2016)  CBC    Component Value Date/Time   WBC 5.5 03/17/2016 1116   WBC 5.1 07/11/2014 1307   RBC 4.45 03/17/2016 1116   RBC 4.44 07/11/2014 1307   HGB 13.2 07/11/2014 1307   HCT 41.5 03/17/2016 1116   PLT 280 03/17/2016 1116   MCV 93 03/17/2016 1116   MCH 30.8 03/17/2016 1116   MCH 29.7 07/11/2014 1307   MCHC 33.0 03/17/2016 1116   MCHC 33.0 07/11/2014 1307   RDW 14.0 03/17/2016 1116   LYMPHSABS 1.8 07/11/2014 1307   MONOABS 0.4 07/11/2014 1307   EOSABS 0.2 07/11/2014 1307   BASOSABS 0.1 07/11/2014 1307    Assessment/Plan:  Counseled patient that Stelara is a IL-12 & 23 inhibitor.  Reviewed Stelara dose of 45 mg at week 0, 4, then every 12 weeks.  Counseled patient on purpose, proper use, and adverse effects of Stelara.  Reviewed the most common adverse effects including infection and injection site reactions. Discussed that there is the possibility of an increased risk of malignancy and rare cases of RPLS.  Reviewed the signs and symptoms of RPLS with patient.  Reviewed the importance of regular labs while on Stelara therapy.  Counseled patient that Stelara should be held prior to scheduled surgery.  Counseled patient to avoid live vaccines while on Stelara.  Provided patient with medication education material and answered all questions.  Patient voiced understanding.  Patient consented to Stelara.  Will upload consent into the media tab.  Reviewed storage instructions of Stelara.  Will apply for Stelara through patient's insurance.  Advised patient to contact the office to schedule the first Stelara injection if Stelara is approved by insurance/patient assistance program.   Patient voiced understanding.    Lilla Shook, Pharm.D., BCPS, CPP Clinical Pharmacist Pager: [phone] Phone: [phone] 06/16/2016 11:45 AM

## 2016-06-16 NOTE — Progress Notes (Signed)
Office Visit Note  Patient: Tammy [name]             Date of Birth: Feb 02, 1956           MRN: [id number]             PCP: Valentino Nose, MD Referring: Valentino Nose, MD Visit Date: 06/16/2016 Occupation: @    Subjective:  Follow-up (could not tolerate the Otezla GI upset )   History of Present Illness: Tammy [name] is a 61 y.o. female with history of psoriatic arthritis. She states she started [place] about 2 weeks ago. She states after starting that she developed diarrhea and nausea. She couldn't handle the diarrhea but the nausea was severe enough that she could not take Otezla and discontinued the medication after she reached the 30 mg dosage. She is been on prednisone and has decrease it down to 10 mg by mouth daily. She was also given Naprosyn for her lower back pain which she discontinued recently. She continues to have a lot of pain and discomfort in her hands and her lower back. She continues to have active lesions of psoriasis on lower extremities.  Activities of Daily Living:  Patient reports morning stiffness for 4-5 hours.   Patient Reports nocturnal pain.  Difficulty dressing/grooming: Reports Difficulty climbing stairs: Reports Difficulty getting out of chair: Reports Difficulty using hands for taps, buttons, cutlery, and/or writing: Reports   Review of Systems  Constitutional: Positive for fatigue. Negative for night sweats, weight gain, weight loss and weakness.  HENT: Negative for mouth sores, trouble swallowing, trouble swallowing, mouth dryness and nose dryness.   Eyes: Negative for pain, redness, visual disturbance and dryness.  Respiratory: Negative for cough, shortness of breath and difficulty breathing.   Cardiovascular: Negative for chest pain, palpitations, hypertension, irregular heartbeat and swelling in legs/feet.  Gastrointestinal: Negative for blood in stool, constipation and diarrhea.  Endocrine: Negative for increased  urination.  Genitourinary: Negative for vaginal dryness.  Musculoskeletal: Positive for arthralgias, joint pain, joint swelling and morning stiffness. Negative for myalgias, muscle weakness, muscle tenderness and myalgias.  Skin: Positive for rash. Negative for color change, hair loss, skin tightness, ulcers and sensitivity to sunlight.       Psoriasis on extremities  Allergic/Immunologic: Negative for susceptible to infections.  Neurological: Negative for dizziness, memory loss and night sweats.  Hematological: Negative for swollen glands.  Psychiatric/Behavioral: Positive for sleep disturbance. The patient is nervous/anxious.     PMFS History:  Patient Active Problem List   Diagnosis Date Noted  . Chest pain 06/02/2016  . Prediabetes 06/01/2016  . Transaminitis 04/25/2016  . Low back pain 04/25/2016  . History of anemia 03/17/2016  . Health care maintenance 03/17/2016  . Psoriatic arthritis (HCC) 07/20/2015  . Interstitial cystitis 07/20/2015  . Palpitations 08/13/2012  . Insomnia 08/13/2012  . Hyperlipidemia 08/13/2012  . Essential hypertension, benign 02/28/2012  . Cervical dysplasia 01/26/2012  . Colon polyp 01/26/2012  . Pulmonary nodule 11/27/2011  . Obstructive sleep apnea-question of 11/27/2011  . ESOPHAGEAL STRICTURE 12/05/2007  . GERD 12/05/2007    Past Medical History:  Diagnosis Date  . Abnormal Papanicolaou smear of cervix with positive human papilloma virus (HPV) test 04/2016  . Allergic rhinitis, cause unspecified   . Carpal tunnel syndrome   . Chronic interstitial cystitis   . Colon polyps 03/07/2004  . Diaphragmatic hernia without mention of obstruction or gangrene   . Dysplasia of cervix, unspecified   . Esophageal reflux   . Essential  hypertension, benign   . Gastric ulcer, unspecified as acute or chronic, without mention of hemorrhage, perforation, or obstruction   . Hiatal hernia   . Hypertension   . Insomnia, unspecified   . Irritable bowel  syndrome   . Lung nodule 11/27/2011   CXR 11/14/2011-nodule versus osteophyte projecting over thoracic spine on lateral view CT chest 11/24/2011- confirms osteophyte with 2 small nodules right lower lobe and left lower lobe. CT 06/01/12- stable for 5 years.  No further follow-up necessary.   . Methicillin resistant Staphylococcus aureus in conditions classified elsewhere and of unspecified site   . Mixed incontinence urge and stress (female)(female)   . Personal history of colonic polyps   . Pure hypercholesterolemia     Family History  Problem Relation Age of Onset  . Hyperlipidemia Mother   . Hypertension Mother   . Hypertension Father   . Coronary artery disease Father 75    stents  . Hyperlipidemia Father    Past Surgical History:  Procedure Laterality Date  . ABDOMINAL HYSTERECTOMY  03/07/2000   Myoma with abnormal uterine bleeding; benign pathology; OVARIES INTACT  . CARPAL TUNNEL RELEASE     Bilateral  . COLONOSCOPY  03/07/2004   colon polyps single.  [place].  . TONSILLECTOMY AND ADENOIDECTOMY     Social History   Social History Narrative   Marital status:  Married since 1976; happy; no abuse. Husband on long term disability.       Lives with: husband; Pt's daughter and grandchild also live in the home.        Children:  2 children, 1 stepchild and 5 grandchildren.        Exercise:  None       Objective: Vital Signs: BP 120/70   Pulse 70   Resp 14   Wt 172 lb (78 kg)   BMI 33.59 kg/m    Physical Exam  Constitutional: She is oriented to person, place, and time. She appears well-developed and well-nourished.  HENT:  Head: Normocephalic and atraumatic.  Eyes: Conjunctivae and EOM are normal.  Neck: Normal range of motion.  Cardiovascular: Normal rate, regular rhythm, normal heart sounds and intact distal pulses.   Pulmonary/Chest: Effort normal and breath sounds normal.  Abdominal: Soft. Bowel sounds are normal.  Lymphadenopathy:    She has no cervical adenopathy.    Neurological: She is alert and oriented to person, place, and time.  Skin: Skin is warm and dry. Capillary refill takes less than 2 seconds. Rash noted.  Psoriasis patches on extremities and trunk  Psychiatric: She has a normal mood and affect. Her behavior is normal.  Nursing note and vitals reviewed.    Musculoskeletal Exam: Limited range of motion of C-spine and lumbar spine. SI joint tenderness. Shoulder joints elbow joints wrist joints are good range of motion. She is tenderness and synovitis over bilateral PIP joints. She is warmth and swelling in her bilateral knee joints. She is tenderness across MTP joints but no synovitis or dactylitis was noted. She does have plantar fasciitis with tenderness over bilateral plantar fascia.  CDAI Exam: CDAI Homunculus Exam:   Tenderness:  RUE: glenohumeral and ulnohumeral and radiohumeral LUE: glenohumeral and ulnohumeral and radiohumeral Right hand: 2nd PIP, 3rd PIP, 4th PIP and 5th PIP Left hand: 2nd PIP, 3rd PIP, 4th PIP and 5th PIP RLE: tibiofemoral LLE: tibiofemoral Right foot: 1st MTP, 2nd MTP and 5th MTP Left foot: 3rd MTP and 4th MTP  Swelling:  Right hand: 2nd PIP, 3rd PIP,  4th PIP and 5th PIP Left hand: 2nd PIP, 3rd PIP, 4th PIP and 5th PIP RLE: tibiofemoral LLE: tibiofemoral  Joint Counts:  CDAI Tender Joint count: 14 CDAI Swollen Joint count: 10  Global Assessments:  Patient Global Assessment: 7 Provider Global Assessment: 6  CDAI Calculated Score: 37    Investigation: No additional findings.  Office Visit on 06/01/2016  Component Date Value Ref Range Status  . Hemoglobin A1C 06/01/2016 5.5   Final  . Glucose-Capillary 06/01/2016 92  65 - 99 mg/dL Final  Office Visit on 05/26/2016  Component Date Value Ref Range Status  . Sed Rate 05/26/2016 7  0 - 30 mm/hr Final  . Total CK 05/26/2016 53  7 - 177 U/L Final  . TSH 05/26/2016 0.83  mIU/L Final   Comment:   Reference Range   > or = 20 Years  0.40-4.50    Pregnancy Range First trimester  0.26-2.66 Second trimester 0.55-2.73 Third trimester  0.43-2.91     . Anit Nuclear Antibody(ANA) 05/26/2016 NEG  NEGATIVE Final   Comment: Although the specimen was negative for antinuclear antibodies (ANA), the presence of cytoplasmic fluorescence was noted on the HEp-2 slide. Other reactivities (e.g., anti-mitochondrial antibodies or anti-smooth muscle antibodies) may be responsible for this fluorescence.   . Total Protein, Serum Electrophores* 05/26/2016 6.6  6.1 - 8.1 g/dL Final  . Albumin ELP 16/12/9602 3.8  3.8 - 4.8 g/dL Final  . VWUJW-1-XBJYNWGN 05/26/2016 0.3  0.2 - 0.3 g/dL Final  . FAOZH-0-QMVHQION 05/26/2016 0.7  0.5 - 0.9 g/dL Final  . Beta Globulin 05/26/2016 0.4  0.4 - 0.6 g/dL Final  . Beta 2 62/95/2841 0.4  0.2 - 0.5 g/dL Final  . Gamma Globulin 05/26/2016 1.1  0.8 - 1.7 g/dL Final  . Abnormal Protein Band1 05/26/2016 NOT DET  g/dL Final  . SPE Interp. 32/44/0102 SEE NOTE   Final  . Abnormal Protein Band2 05/26/2016 NOT DET  g/dL Final  . Abnormal Protein Band3 05/26/2016 NOT DET  g/dL Final  . IgG (Immunoglobin G), Serum 05/26/2016 1142  694 - 1,618 mg/dL Final  . IgA 72/53/6644 211  81 - 463 mg/dL Final  . IgM, Serum 03/47/4259 95  48 - 271 mg/dL Final  . HIV 1&2 Ab, 4th Generation 05/26/2016 NONREACTIVE  NONREACTIVE Final   Comment:   HIV-1 antigen and HIV-1/HIV-2 antibodies were not detected.  There is no laboratory evidence of HIV infection.   HIV-1/2 Antibody Diff        Not indicated. HIV-1 RNA, Qual TMA          Not indicated.     PLEASE NOTE: This information has been disclosed to you from records whose confidentiality may be protected by state law. If your state requires such protection, then the state law prohibits you from making any further disclosure of the information without the specific written consent of the person to whom it pertains, or as otherwise permitted by law. A general authorization for the release  of medical or other information is NOT sufficient for this purpose.   The performance of this assay has not been clinically validated in patients less than 22 years old.   For additional information please refer to http://education.questdiagnostics.com/faq/FAQ106.  (This link is being provided for informational/educational purposes only.)     Office Visit on 05/03/2016  Component Date Value Ref Range Status  . HPV mRNA, High Risk 05/03/2016 Detected*  Final   Comment: One or more High/Intermediate HPV types (16,18,31,33,35,39,45,51,52,56,58,59,66,68) was detected.                  **  Normal Reference Range: Not Detected **      HPV High Risk testing performed using the APTIMA HPV mRNA Assay.     Marland Kitchen Specimen adequacy: 05/03/2016 SEE NOTE   Final  . FINAL DIAGNOSIS: 05/03/2016 SEE NOTE   Final   Comment: - NEGATIVE FOR INTRAEPITHELIAL LESIONS OR MALIGNANCY.   Marland Kitchen COMMENTS: 05/03/2016 SEE NOTE   Final  . Cytotechnologist: 05/03/2016 SEE NOTE   Final   Comment: JLT, BS CT(ASCP) *  The Pap is a screening test for cervical cancer. It is not a  diagnostic test and is subject to false negative and false positive  results. It is most reliable when a satisfactory sample, regularly  obtained, is submitted with relevant clinical findings and history,  and when the Pap result is evaluated along with historic and current  clinical information.   Marland Kitchen HPV Type 16 RNA 05/03/2016 Not Detected   Final  . HPV Type 18/45 RNA 05/03/2016 Not Detected   Final   Comment:                  ** Normal Reference Range: Not Detected **        HPV High Risk testing performed using the APTIMA HPV 16, 18/45      mRNA Assay.   Office Visit on 04/19/2016  Component Date Value Ref Range Status  . Hep B Surface Ab, Qual 04/19/2016 Non Reactive   Final   Comment:               Non Reactive: Inconsistent with immunity,                             less than 10 mIU/mL               Reactive:     Consistent with  immunity,                             greater than 9.9 mIU/mL   . Hepatitis B Surface Ag 04/19/2016 Negative  Negative Final  . Hep B Core Total Ab 04/19/2016 Negative  Negative Final    Imaging: Xr Hip Unilat W Or W/o Pelvis 2-3 Views Right  Result Date: 05/26/2016 Normal hip joint space was noted no SI joint changes were noted. Normal x-ray of the hip  Xr Hips Bilat W Or W/o Pelvis 2v  Result Date: 05/26/2016 Normal hip joint space was noted no SI joint changes were noted. Normal x-ray of the hip  Xr Foot 2 Views Left  Result Date: 05/26/2016 Left first MTP narrowing PIP/DIP narrowing without any erosive changes a small calcaneal spur was noted. No erosive changes were noted. 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 Multilevel spondylosis was noted. Mild L4-5 listhesis was noted. She has significant narrowing between L2-3 vertebrae. No syndesmophytes were noted. No SI joint sclerosis or narrowing was noted. Impression: Multilevel lumbar spondylosis   Speciality Comments: No specialty comments available.    Procedures:  No procedures performed Allergies:  Lisinopril and Septra ds [sulfamethoxazole-trimethoprim]   Assessment / Plan:     Visit Diagnoses: Psoriatic arthritis (HCC)She continues to have a lot of synovitis in her hands and knee joints. Despite being on prednisone she is having swelling and inflammation today. The plan is to start her on Stelara , she has fear of needles for that reason we chose the medication which will have less frequent injections. Indications side effects contraindications of Stelara were discussed today. Informed consent was obtained. The plan is to start her on Stelara once we get it approved. She will need labs in 2 weeks after starting Stelara, one month and then every 3 months to monitor for drug toxicity. We will taper prednisone as a Stelara gets into her system.  Psoriasis: She continues to have active patches of psoriasis on her extremities and few on her trunk.  High risk medication use: She is currently on prednisone 10 mg by mouth daily. [place] was discontinued due to nausea.MTX caused elevated LFTs in the past.  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  No orders of the defined types were placed in this encounter.   Face-to-face time spent with patient was 30 minutes. 50% of time was spent in counseling and coordination of care.  Follow-Up Instructions: Return in about 2 months (around 08/16/2016) for Psoriatic arthritis psoriasis.   Pollyann Savoy, MD  Note - This record has been created using Animal nutritionist.  Chart creation errors have been sought, but may not always  have been located. Such creation errors do not reflect on  the standard of medical care.

## 2016-06-16 NOTE — Telephone Encounter (Signed)
[facility]err pt last seen by Hyacinth Meeker PA in January. She was scheduled for [facility] but had to cancel the appt and reschedule it to a later date. Pt is scheduled for 06/22/16 for ECL. Pt was placed on Prednisone dose pack by her rheumatologist and wants to make sure it is ok that she will be on the prednisone when she has procedure. Dr. Lavon Paganini as doc of the day please advise.

## 2016-06-16 NOTE — Patient Instructions (Signed)
Ustekinumab injection  What is this medicine?  USTEKINUMAB (US te KIN ue mab) is used to treat plaque psoriasis and psoriatic arthritis. This medicine is also used to treat Crohn's disease. It is not a cure.  This medicine may be used for other purposes; ask your health care provider or pharmacist if you have questions.  COMMON BRAND NAME(S): Stelara  What should I tell my health care provider before I take this medicine?  They need to know if you have any of these conditions:  -cancer  -diabetes  -immune system problems  -infection (especially a virus infection such as chickenpox, cold sores, or herpes) or history of infections  -receiving or have received allergy shots  -recently received or scheduled to receive a vaccine  -tuberculosis, a positive skin test for tuberculosis, or have recently been in close contact with someone who has tuberculosis  -an unusual reaction to ustekinumab, latex, other medicines, foods, dyes, or preservatives  -pregnant or trying to get pregnant  -breast-feeding  How should I use this medicine?  This medicine is for injection under the skin or infusion into a vein. It is usually given by a health care professional in a hospital or clinic setting.  If you get this medicine at home, you will be taught how to prepare and give this medicine. Use exactly as directed. Take your medicine at regular intervals. Do not take your medicine more often than directed.  It is important that you put your used needles and syringes in a special sharps container. Do not put them in a trash can. If you do not have a sharps container, call your pharmacist or healthcare provider to get one.  A special MedGuide will be given to you by the pharmacist with each prescription and refill. Be sure to read this information carefully each time.  Talk to your pediatrician regarding the use of this medicine in children. While this drug may be prescribed for children as young as 12 years for selected conditions,  precautions do apply.  Overdosage: If you think you have taken too much of this medicine contact a poison control center or emergency room at once.  NOTE: This medicine is only for you. Do not share this medicine with others.  What if I miss a dose?  If you miss a dose, take it as soon as you can. If it is almost time for your next dose, take only that dose. Do not take double or extra doses.  What may interact with this medicine?  Do not take this medicine with any of the following medications:  -live virus vaccines  This medicine may also interact with the following medications:  -cyclosporine  -immunosuppressives  -vaccines  -warfarin  This list may not describe all possible interactions. Give your health care provider a list of all the medicines, herbs, non-prescription drugs, or dietary supplements you use. Also tell them if you smoke, drink alcohol, or use illegal drugs. Some items may interact with your medicine.  What should I watch for while using this medicine?  Your condition will be monitored carefully while you are receiving this medicine. Tell your doctor or healthcare professional if your symptoms do not start to get better or if they get worse.  You will be tested for tuberculosis (TB) before you start this medicine. If your doctor prescribes any medicine for TB, you should start taking the TB medicine before starting this medicine. Make sure to finish the full course of TB medicine.  Call your   doctor or health care professional if you get a cold or other infection while receiving this medicine. Do not treat yourself. This medicine may decrease your body's ability to fight infection.  Talk to your doctor about your risk of cancer. You may be more at risk for certain types of cancers if you take this medicine.  What side effects may I notice from receiving this medicine?  Side effects that you should report to your doctor or health care professional as soon as possible:  -allergic reactions like skin  rash, itching or hives, swelling of the face, lips, or tongue  -breathing problems  -changes in vision  -confusion  -seizures  -signs and symptoms of infection like fever or chills; cough; sore throat; pain or trouble passing urine  -swollen lymph nodes in the neck, underarm, or groin areas  -unexplained weight loss  -unusually weak or tired  -vomiting  Side effects that usually do not require medical attention (report to your doctor or health care professional if they continue or are bothersome):  -headache  -nausea  -redness, itching, swelling, or bruising at site where injected  This list may not describe all possible side effects. Call your doctor for medical advice about side effects. You may report side effects to FDA at 1-800-FDA-1088.  Where should I keep my medicine?  Keep out of the reach of children.  If you are using this medicine at home, you will be instructed on how to store this medicine. Store the prefilled syringes in a refrigerator between 2 to 8 degrees C (36 to 46 degrees F). Keep in the original carton. Protect from light. Do not freeze. Do not shake. Throw away any unused medicine after the expiration date on the label.  NOTE: This sheet is a summary. It may not cover all possible information. If you have questions about this medicine, talk to your doctor, pharmacist, or health care provider.   2018 Elsevier/Gold Standard (2015-12-22 09:12:47)

## 2016-06-17 NOTE — Telephone Encounter (Signed)
Spoke with pt and she is aware.

## 2016-06-20 ENCOUNTER — Telehealth: Payer: Self-pay | Admitting: Pharmacist

## 2016-06-20 MED ORDER — USTEKINUMAB 45 MG/0.5ML ~~LOC~~ SOLN: SUBCUTANEOUS | 0 refills | Status: DC

## 2016-06-20 NOTE — Telephone Encounter (Signed)
Stelara was approved by patient's insurance (Reference # [id number], Effective dates 06/16/16 - 03/06/2038).    I called patient to inform of the approval.  Prescription sent to patient's pharmacy.  Discussed that initial Stelara injection must be administered at the office.  Patient prefers to have all the Stelara injections administered in the office.  Will add note to have the prescriptions delivered to the office.  I provided patient with instructions on how to sign up for the [employer] co-pay card.  Patient confirms she will sign up.  Patient denies any questions or concerns regarding her medications at this time.  Will call patient to schedule nurse visit for initial Stelara injection once medication is delivered to the office.    Lilla Shook, Pharm.D., BCPS, CPP Clinical Pharmacist Pager: [phone] Phone: [phone] 06/20/2016 2:14 PM

## 2016-06-22 ENCOUNTER — Encounter: Payer: Self-pay | Admitting: Internal Medicine

## 2016-06-22 NOTE — Telephone Encounter (Signed)
Pts daughter is calling and will be over to pick up the prep for pts tomorrows procedure.

## 2016-06-23 ENCOUNTER — Encounter: Payer: Self-pay | Admitting: Internal Medicine

## 2016-06-23 ENCOUNTER — Ambulatory Visit (AMBULATORY_SURGERY_CENTER): Payer: BLUE CROSS/BLUE SHIELD | Admitting: Internal Medicine

## 2016-06-23 VITALS — BP 110/70 | HR 58 | Temp 97.1°F | Resp 12 | Ht 59.5 in | Wt 171.0 lb

## 2016-06-23 DIAGNOSIS — K219 Gastro-esophageal reflux disease without esophagitis: Secondary | ICD-10-CM

## 2016-06-23 DIAGNOSIS — Z1211 Encounter for screening for malignant neoplasm of colon: Secondary | ICD-10-CM | POA: Diagnosis present

## 2016-06-23 DIAGNOSIS — Q393 Congenital stenosis and stricture of esophagus: Secondary | ICD-10-CM | POA: Diagnosis not present

## 2016-06-23 DIAGNOSIS — R12 Heartburn: Secondary | ICD-10-CM

## 2016-06-23 DIAGNOSIS — Z86010 Personal history of colonic polyps: Secondary | ICD-10-CM

## 2016-06-23 DIAGNOSIS — K222 Esophageal obstruction: Secondary | ICD-10-CM

## 2016-06-23 DIAGNOSIS — Z1212 Encounter for screening for malignant neoplasm of rectum: Secondary | ICD-10-CM | POA: Diagnosis not present

## 2016-06-23 MED ORDER — SODIUM CHLORIDE 0.9 % IV SOLN: 500.0000 mL | INTRAVENOUS | Status: AC

## 2016-06-23 NOTE — Patient Instructions (Signed)
YOU HAD AN ENDOSCOPIC PROCEDURE TODAY AT THE [facility]:   Refer to the procedure report that was given to you for any specific questions about what was found during the examination.  If the procedure report does not answer your questions, please call your gastroenterologist to clarify.  If you requested that your care partner not be given the details of your procedure findings, then the procedure report has been included in a sealed envelope for you to review at your convenience later.  YOU SHOULD EXPECT: Some feelings of bloating in the abdomen. Passage of more gas than usual.  Walking can help get rid of the air that was put into your GI tract during the procedure and reduce the bloating. If you had a lower endoscopy (such as a colonoscopy or flexible sigmoidoscopy) you may notice spotting of blood in your stool or on the toilet paper. If you underwent a bowel prep for your procedure, you may not have a normal bowel movement for a few days.  Please Note:  You might notice some irritation and congestion in your nose or some drainage.  This is from the oxygen used during your procedure.  There is no need for concern and it should clear up in a day or so.  SYMPTOMS TO REPORT IMMEDIATELY:   Following lower endoscopy (colonoscopy or flexible sigmoidoscopy):  Excessive amounts of blood in the stool  Significant tenderness or worsening of abdominal pains  Swelling of the abdomen that is new, acute  Fever of 100F or higher   Following upper endoscopy (EGD)  Vomiting of blood or coffee ground material  New chest pain or pain under the shoulder blades  Painful or persistently difficult swallowing  New shortness of breath  Fever of 100F or higher  Black, tarry-looking stools  For urgent or emergent issues, a gastroenterologist can be reached at any hour by calling [phone].   DIET:  We do recommend a small meal at first, but then you may proceed to your regular diet.  Drink  plenty of fluids but you should avoid alcoholic beverages for 24 hours.  ACTIVITY:  You should plan to take it easy for the rest of today and you should NOT DRIVE or use heavy machinery until tomorrow (because of the sedation medicines used during the test).    FOLLOW UP: Our staff will call the number listed on your records the next business day following your procedure to check on you and address any questions or concerns that you may have regarding the information given to you following your procedure. If we do not reach you, we will leave a message.  However, if you are feeling well and you are not experiencing any problems, there is no need to return our call.  We will assume that you have returned to your regular daily activities without incident.  SIGNATURES/CONFIDENTIALITY: You and/or your care partner have signed paperwork which will be entered into your electronic medical record.  These signatures attest to the fact that that the information above on your After Visit Summary has been reviewed and is understood.  Full responsibility of the confidentiality of this discharge information lies with you and/or your care-partner.  Continue your normal medications  Please read over handout about reflux  Next colonoscopy- 10 years  Return to the care of your family doctor

## 2016-06-23 NOTE — Op Note (Signed)
[facility] Patient Name: Tammy [name] Procedure Date: 06/23/2016 2:32 PM MRN: [id number] Endoscopist: Wilhemina Bonito. Marina Goodell , MD Age: 61 Referring MD:  Date of Birth: 07-Feb-1956 Gender: Female Account #: [id number] Procedure:                Colonoscopy Indications:              High risk colon cancer surveillance: Personal                            history of colonic polyps. Previous colonoscopy                            2007 with diminutive polyp without pathology Medicines:                Monitored Anesthesia Care Procedure:                Pre-Anesthesia Assessment:                           - Prior to the procedure, a History and Physical                            was performed, and patient medications and                            allergies were reviewed. The patient's tolerance of                            previous anesthesia was also reviewed. The risks                            and benefits of the procedure and the sedation                            options and risks were discussed with the patient.                            All questions were answered, and informed consent                            was obtained. Prior Anticoagulants: The patient has                            taken no previous anticoagulant or antiplatelet                            agents. ASA Grade Assessment: II - A patient with                            mild systemic disease. After reviewing the risks                            and benefits, the patient was deemed in  satisfactory condition to undergo the procedure.                           After obtaining informed consent, the colonoscope                            was passed under direct vision. Throughout the                            procedure, the patient's blood pressure, pulse, and                            oxygen saturations were monitored continuously. The                            Colonoscope was  introduced through the anus and                            advanced to the the cecum, identified by                            appendiceal orifice and ileocecal valve. The                            ileocecal valve, appendiceal orifice, and rectum                            were photographed. The quality of the bowel                            preparation was excellent. The colonoscopy was                            performed without difficulty. The patient tolerated                            the procedure well. The bowel preparation used was                            SUPREP. Scope In: 2:51:38 PM Scope Out: 3:01:52 PM Scope Withdrawal Time: 0 hours 8 minutes 58 seconds  Total Procedure Duration: 0 hours 10 minutes 14 seconds  Findings:                 The entire examined colon appeared normal on direct                            and retroflexion views. Complications:            No immediate complications. Estimated blood loss:                            None. Estimated Blood Loss:     Estimated blood loss: none. Impression:               - The entire examined colon is normal on  direct and                            retroflexion views.                           - No specimens collected. Recommendation:           - Repeat colonoscopy in 10 years for screening                            purposes.                           - Patient has a contact number available for                            emergencies. The signs and symptoms of potential                            delayed complications were discussed with the                            patient. Return to normal activities tomorrow.                            Written discharge instructions were provided to the                            patient.                           - Resume previous diet.                           - Continue present medications. Wilhemina Bonito. Marina Goodell, MD 06/23/2016 3:07:35 PM This report has been signed  electronically.

## 2016-06-23 NOTE — Progress Notes (Signed)
Report to PACU, RN, vss, BBS= Clear.  

## 2016-06-23 NOTE — Op Note (Signed)
[facility] Patient Name: Tammy [name] Procedure Date: 06/23/2016 2:32 PM MRN: [id number] Endoscopist: Wilhemina Bonito. Marina Goodell , MD Age: 61 Referring MD:  Date of Birth: 1955/04/21 Gender: Female Account #: [id number] Procedure:                Upper GI endoscopy Indications:              Suspected esophageal reflux, Medicines:                Monitored Anesthesia Care Procedure:                Pre-Anesthesia Assessment:                           - Prior to the procedure, a History and Physical                            was performed, and patient medications and                            allergies were reviewed. The patient's tolerance of                            previous anesthesia was also reviewed. The risks                            and benefits of the procedure and the sedation                            options and risks were discussed with the patient.                            All questions were answered, and informed consent                            was obtained. Prior Anticoagulants: The patient has                            taken no previous anticoagulant or antiplatelet                            agents. ASA Grade Assessment: II - A patient with                            mild systemic disease. After reviewing the risks                            and benefits, the patient was deemed in                            satisfactory condition to undergo the procedure.                           After obtaining informed consent, the endoscope was  passed under direct vision. Throughout the                            procedure, the patient's blood pressure, pulse, and                            oxygen saturations were monitored continuously. The                            Endoscope was introduced through the mouth, and                            advanced to the second part of duodenum. The upper                            GI endoscopy was  accomplished without difficulty.                            The patient tolerated the procedure well. Scope In: Scope Out: Findings:                 Incidental large caliber distal esophageal ring.The                            esophagus was otherwise normal.                           The stomach was normal with nonspecific mild patchy                            erythema.                           The examined duodenum was normal.                           The cardia and gastric fundus were normal on                            retroflexion.                           One benign-appearing, intrinsic stenosis was found. Complications:            No immediate complications. Estimated Blood Loss:     Estimated blood loss: none. Impression:               1. GERD                           2. Normal EGD safe incidental distal esophageal                            ring. Recommendation:           1.reflux precautions                           2. Continue  present medications.                           3. Return to the care of your [facility]                            follow-up as needed Wilhemina Bonito. Marina Goodell, MD 06/23/2016 3:14:55 PM This report has been signed electronically.

## 2016-06-24 ENCOUNTER — Ambulatory Visit: Payer: BLUE CROSS/BLUE SHIELD | Admitting: Physician Assistant

## 2016-06-24 ENCOUNTER — Encounter: Payer: Self-pay | Admitting: Pharmacist

## 2016-06-24 ENCOUNTER — Telehealth: Payer: Self-pay | Admitting: Rheumatology

## 2016-06-24 ENCOUNTER — Telehealth: Payer: Self-pay

## 2016-06-24 NOTE — Telephone Encounter (Signed)
  Follow up Call-  Call back number 06/23/2016  Post procedure Call Back phone  # [phone]  Permission to leave phone message Yes  Some recent data might be hidden     Patient questions:  Do you have a fever, pain , or abdominal swelling? No. Pain Score  0 *  Have you tolerated food without any problems? Yes.    Have you been able to return to your normal activities? Yes.    Do you have any questions about your discharge instructions: Diet   No. Medications  No. Follow up visit  No.  Do you have questions or concerns about your Care? No.  Actions: * If pain score is 4 or above: No action needed, pain <4.

## 2016-06-24 NOTE — Telephone Encounter (Signed)
[facility] called and they have arranged for patient's stelara to be delivered to the office on Thursday.

## 2016-06-24 NOTE — Telephone Encounter (Signed)
Great, thank you I have called patient to arrange an appt for a new start visit for after Thursday to you [facility]

## 2016-06-24 NOTE — Progress Notes (Deleted)
Cardiology Office Note    Date:  06/24/2016   ID:  Isadore, Bokhari 1955-06-17, MRN [id number]  PCP:  Valentino Nose, MD  Cardiologist:  Dr. [place]  No chief complaint on file.   History of Present Illness:  Tammy [name] is a 61 y.o. female with PMH of PVCs and HTN. She also has a history of atypical chest pain. She had normal stress Myoview in 2009. In October 2012, she had a cardiac catheterization which was normal as well. Her last office follow-up with cardiology service was on 08/31/2012, her blood pressure was elevated at 142/90. She was considered not a good candidate for beta blocker due to low resting heart rate. Lisinopril was initiated by later discontinued due to cough. He was started on Cozaar, but continued to have elevated blood pressure. During the last office visit, her losartan was increased 200 mg daily. She also uses Lasix as needed for swelling.  No EKG  Past Medical History:  Diagnosis Date  . Abnormal Papanicolaou smear of cervix with positive human papilloma virus (HPV) test 04/2016  . Allergic rhinitis, cause unspecified   . Carpal tunnel syndrome   . Chronic interstitial cystitis   . Colon polyps 03/07/2004  . Diaphragmatic hernia without mention of obstruction or gangrene   . Dysplasia of cervix, unspecified   . Esophageal reflux   . Essential hypertension, benign   . Gastric ulcer, unspecified as acute or chronic, without mention of hemorrhage, perforation, or obstruction   . Hiatal hernia   . Hypertension   . Insomnia, unspecified   . Irritable bowel syndrome   . Lung nodule 11/27/2011   CXR 11/14/2011-nodule versus osteophyte projecting over thoracic spine on lateral view CT chest 11/24/2011- confirms osteophyte with 2 small nodules right lower lobe and left lower lobe. CT 06/01/12- stable for 5 years.  No further follow-up necessary.   . Methicillin resistant Staphylococcus aureus in conditions classified elsewhere and of unspecified site    . Mixed incontinence urge and stress (female)(female)   . Personal history of colonic polyps   . Pure hypercholesterolemia     Past Surgical History:  Procedure Laterality Date  . ABDOMINAL HYSTERECTOMY  03/07/2000   Myoma with abnormal uterine bleeding; benign pathology; OVARIES INTACT  . CARPAL TUNNEL RELEASE     Bilateral  . COLONOSCOPY  03/07/2004   colon polyps single.  [place].  . TONSILLECTOMY AND ADENOIDECTOMY      Current Medications: Outpatient Medications Prior to Visit  Medication Sig Dispense Refill  . albuterol (PROVENTIL HFA;VENTOLIN HFA) 108 (90 BASE) MCG/ACT inhaler Inhale 2 puffs into the lungs every 6 (six) hours as needed.    Marland Kitchen albuterol (PROVENTIL) (2.5 MG/3ML) 0.083% nebulizer solution Take 2.5 mg by nebulization every 6 (six) hours as needed.    Marland Kitchen aspirin 81 MG tablet Take 81 mg by mouth daily.    Marland Kitchen b complex vitamins tablet Take 1 tablet by mouth daily.    . ferrous sulfate 325 (65 FE) MG tablet Take 325 mg by mouth daily with breakfast.    . hydrochlorothiazide (HYDRODIURIL) 25 MG tablet Take 1 tablet (25 mg total) by mouth daily. 90 tablet 2  . hydrocortisone cream 1 % Apply to affected area on face 2 times daily 30 g 1  . Hyoscyamine Sulfate SL (LEVSIN/SL) 0.125 MG SUBL Place 0.125 mg under the tongue every 4 (four) hours as needed. 30 each 3  . losartan (COZAAR) 25 MG tablet Take 1 tablet (25 mg total)  by mouth daily. 30 tablet 2  . metoprolol tartrate (LOPRESSOR) 25 MG tablet take ONE-HALF TABLET BY MOUTH AS NEEDED (Patient taking differently: take ONE-HALF TABLET BY EVERY DAY AND AS NEEDED) 30 tablet 3  . Multiple Vitamin (MULTIVITAMIN) tablet Take 1 tablet by mouth daily.    . naproxen (NAPROSYN) 500 MG tablet Take 1 tablet (500 mg total) by mouth 2 (two) times daily with a meal. 60 tablet 2  . ondansetron (ZOFRAN) 8 MG tablet Take 1 tablet (8 mg total) by mouth as directed. 10 tablet 0  . pantoprazole (PROTONIX) 40 MG tablet Take 1 tablet (40 mg total) by  mouth daily. 30 tablet 1  . pravastatin (PRAVACHOL) 40 MG tablet Take 1 tablet (40 mg total) by mouth every evening. 90 tablet 4  . predniSONE (DELTASONE) 5 MG tablet Take 4 tabs PO QD x7 days, then 3 tabs PO QD x7 days, then 2 tabs PO QD x7 days, then 1 tab PO QD x7 days, then 1/2 tab PO QD x7 days 74 tablet 0  . simethicone (MYLICON) 125 MG chewable tablet Chew 125 mg by mouth every 6 (six) hours as needed for flatulence.    . triamcinolone cream (KENALOG) 0.1 % Apply to lesions 2-4 times a day as needed to lesions. Do not apply to your face. (Patient not taking: Reported on 06/23/2016) 30 g 0  . Ustekinumab 45 MG/0.5ML SOLN Inject 45 mcg under the skin at 0 and 4 weeks for Stelara loading dose (Patient not taking: Reported on 06/23/2016) 1 mL 0  . Vitamin D, Cholecalciferol, 1000 UNITS TABS Take by mouth daily.     Facility-Administered Medications Prior to Visit  Medication Dose Route Frequency Provider Last Rate Last Dose  . 0.9 %  sodium chloride infusion  500 mL Intravenous Continuous Hilarie Fredrickson, MD         Allergies:   Lisinopril and Septra ds [sulfamethoxazole-trimethoprim]   Social History   Social History  . Marital status: Married    Spouse name: N/A  . Number of children: 2  . Years of education: N/A   Occupational History  . 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 Smoking status: Never Smoker  . Smokeless tobacco: Never Used  . Alcohol use 0.0 oz/week     Comment: Occassional use: <3 drinks/month  . Drug use: No  . Sexual activity: Yes    Partners: Male   Other Topics Concern  . Not on file   Social History Narrative   Marital status:  Married since 1976; happy; no abuse. Husband on long term disability.       Lives with: husband; Pt's daughter and grandchild also live in the home.        Children:  2 children, 1 stepchild and 5 grandchildren.        Exercise:  None        Family History:  The patient's ***family history includes Coronary artery disease (age of onset: 19) in her father; Hyperlipidemia in her father and mother; Hypertension in her father and mother.   ROS:   Please see the history of present illness.    ROS All other systems reviewed and are negative.   PHYSICAL EXAM:   VS:  There were no vitals taken for this visit.   GEN: Well nourished, well developed, in no acute distress  HEENT: normal  Neck: no JVD, carotid bruits, or  masses Cardiac: ***RRR; no murmurs, rubs, or gallops,no edema  Respiratory:  clear to auscultation bilaterally, normal work of breathing GI: soft, nontender, nondistended, + BS MS: no deformity or atrophy  Skin: warm and dry, no rash Neuro:  Alert and Oriented x 3, Strength and sensation are intact Psych: euthymic mood, full affect  Wt Readings from Last 3 Encounters:  06/23/16 171 lb (77.6 kg)  06/16/16 172 lb (78 kg)  06/01/16 174 lb 12.8 oz (79.3 kg)      Studies/Labs Reviewed:   EKG:  EKG is*** ordered today.  The ekg ordered today demonstrates ***  Recent Labs: 03/17/2016: ALT 42; BUN 11; Creatinine, Ser 0.65; Platelets 280; Potassium 3.9; Sodium 142 05/26/2016: TSH 0.83   Lipid Panel    Component Value Date/Time   CHOL 244 (H) 03/17/2016 1116   TRIG 255 (H) 03/17/2016 1116   HDL 34 (L) 03/17/2016 1116   CHOLHDL 7.2 (H) 03/17/2016 1116   CHOLHDL 4.7 07/11/2014 1307   VLDL 42 (H) 07/11/2014 1307   LDLCALC 159 (H) 03/17/2016 1116    Additional studies/ records that were reviewed today include:   Cath 12/13/2010 FINDINGS: 1. Right coronary artery.  The right coronary artery was a dominant     vessel.  No angiographic coronary disease. 2. Left main.  The left main had no angiographic coronary disease. 3. Left circumflex system.  There was a moderate sized ramus and there     was no angiographic coronary disease in the circumflex system. 4. LAD system.  There was a large first diagonal.  There  was no     angiographic coronary disease in the LAD system.  IMPRESSION:  No angiographic coronary disease.  The chest pain was noncardiac.    ASSESSMENT:    No diagnosis found.   PLAN:  In order of problems listed above:  1. ***    Medication Adjustments/Labs and Tests Ordered: Current medicines are reviewed at length with the patient today.  Concerns regarding medicines are outlined above.  Medication changes, Labs and Tests ordered today are listed in the Patient Instructions below. There are no Patient Instructions on file for this visit.   Ramond Dial, [place]  06/24/2016 1:17 PM    [facility] [address] [place], [place], [place]  [address] Phone: [phone]; Fax: [phone]

## 2016-06-29 ENCOUNTER — Other Ambulatory Visit: Payer: Self-pay | Admitting: Internal Medicine

## 2016-06-29 ENCOUNTER — Telehealth: Payer: Self-pay

## 2016-06-29 DIAGNOSIS — K219 Gastro-esophageal reflux disease without esophagitis: Secondary | ICD-10-CM

## 2016-06-29 NOTE — Telephone Encounter (Signed)
Received a fax from [employer] that the patient has been approved for savings for Stelara. She must activate the card before it can be used. It can be activated online at [email or web address] or call [phone]. This card can be used to save on their out-of-pocket medication cost for Stelara.   Left message for patient to call back to update her.  Will send document to scan center.  Foster Frericks, [place], CPhT 9:47 AM

## 2016-06-30 ENCOUNTER — Ambulatory Visit: Payer: Self-pay | Admitting: Rheumatology

## 2016-06-30 ENCOUNTER — Telehealth: Payer: Self-pay | Admitting: Pharmacist

## 2016-06-30 NOTE — Telephone Encounter (Signed)
Received patient's Stelara prescription from [facility].  I called patient to schedule a nursing visit.  I left a message for patient.     Lilla Shook, Pharm.D., BCPS, CPP Clinical Pharmacist Pager: [phone] Phone: [phone] 06/30/2016 8:42 AM

## 2016-07-02 NOTE — Progress Notes (Signed)
Tammy [name] Date of Birth: 07-Nov-1955 Medical Record #[id number]  History of Present Illness: Tammy [name] is seen at the request of Dr. Karma Greaser for evaluation of chest pain and palpitations.  She was last seen in 2014. She has a history of PVCs and hypertension. She also has a history of atypical chest pain. She had a normal stress Myoview in 2009. In October 2012 she had a cardiac catheterization which was normal.  Lisinopril was discontinued because of cough. She has not tolerated higher dose beta blockers because of a low resting heart rate.   She was seen by primary care in March with some palpitations and chest pain. She thinks this is all related to taking steroids for her psoriasis. She has gained weight. She has also been under considerable stress with her husband's illnesses.  An event monitor was ordered but she deferred. She does note she is sometimes aware of her heart beat. She feels fatigued. She is back on losartan HCT for BP and low dose metoprolol. Takes lasix on a prn basis.   Current Outpatient Prescriptions on File Prior to Visit  Medication Sig Dispense Refill  . albuterol (PROVENTIL HFA;VENTOLIN HFA) 108 (90 BASE) MCG/ACT inhaler Inhale 2 puffs into the lungs every 6 (six) hours as needed.    Marland Kitchen albuterol (PROVENTIL) (2.5 MG/3ML) 0.083% nebulizer solution Take 2.5 mg by nebulization every 6 (six) hours as needed.    Marland Kitchen aspirin 81 MG tablet Take 81 mg by mouth daily.    Marland Kitchen b complex vitamins tablet Take 1 tablet by mouth daily.    . ferrous sulfate 325 (65 FE) MG tablet Take 325 mg by mouth daily with breakfast.    . hydrochlorothiazide (HYDRODIURIL) 25 MG tablet Take 1 tablet (25 mg total) by mouth daily. 90 tablet 2  . hydrocortisone cream 1 % Apply to affected area on face 2 times daily 30 g 1  . Hyoscyamine Sulfate SL (LEVSIN/SL) 0.125 MG SUBL Place 0.125 mg under the tongue every 4 (four) hours as needed. 30 each 3  . losartan (COZAAR) 25 MG tablet Take 1 tablet (25 mg  total) by mouth daily. 30 tablet 2  . metoprolol tartrate (LOPRESSOR) 25 MG tablet take ONE-HALF TABLET BY MOUTH AS NEEDED 30 tablet 2  . Multiple Vitamin (MULTIVITAMIN) tablet Take 1 tablet by mouth daily.    . naproxen (NAPROSYN) 500 MG tablet Take 1 tablet (500 mg total) by mouth 2 (two) times daily with a meal. 60 tablet 2  . ondansetron (ZOFRAN) 8 MG tablet Take 1 tablet (8 mg total) by mouth as directed. 10 tablet 0  . pantoprazole (PROTONIX) 40 MG tablet Take 1 tablet (40 mg total) by mouth daily. 90 tablet 2  . pravastatin (PRAVACHOL) 40 MG tablet Take 1 tablet (40 mg total) by mouth every evening. 90 tablet 4  . predniSONE (DELTASONE) 5 MG tablet Take 4 tabs PO QD x7 days, then 3 tabs PO QD x7 days, then 2 tabs PO QD x7 days, then 1 tab PO QD x7 days, then 1/2 tab PO QD x7 days 74 tablet 0  . simethicone (MYLICON) 125 MG chewable tablet Chew 125 mg by mouth every 6 (six) hours as needed for flatulence.    . triamcinolone cream (KENALOG) 0.1 % Apply to lesions 2-4 times a day as needed to lesions. Do not apply to your face. 30 g 0  . Vitamin D, Cholecalciferol, 1000 UNITS TABS Take by mouth daily.     Current Facility-Administered  Medications on File Prior to Visit  Medication Dose Route Frequency Provider Last Rate Last Dose  . 0.9 %  sodium chloride infusion  500 mL Intravenous Continuous Hilarie Fredrickson, MD        Allergies  Allergen Reactions  . Lisinopril Cough  . Septra Ds [Sulfamethoxazole-Trimethoprim] Nausea Only    Past Medical History:  Diagnosis Date  . Abnormal Papanicolaou smear of cervix with positive human papilloma virus (HPV) test 04/2016  . Allergic rhinitis, cause unspecified   . Carpal tunnel syndrome   . Chronic interstitial cystitis   . Colon polyps 03/07/2004  . Diaphragmatic hernia without mention of obstruction or gangrene   . Dysplasia of cervix, unspecified   . Esophageal reflux   . Essential hypertension, benign   . Gastric ulcer, unspecified as acute  or chronic, without mention of hemorrhage, perforation, or obstruction   . Hiatal hernia   . Hypertension   . Insomnia, unspecified   . Irritable bowel syndrome   . Lung nodule 11/27/2011   CXR 11/14/2011-nodule versus osteophyte projecting over thoracic spine on lateral view CT chest 11/24/2011- confirms osteophyte with 2 small nodules right lower lobe and left lower lobe. CT 06/01/12- stable for 5 years.  No further follow-up necessary.   . Methicillin resistant Staphylococcus aureus in conditions classified elsewhere and of unspecified site   . Mixed incontinence urge and stress (female)(female)   . Personal history of colonic polyps   . Pure hypercholesterolemia     Past Surgical History:  Procedure Laterality Date  . ABDOMINAL HYSTERECTOMY  03/07/2000   Myoma with abnormal uterine bleeding; benign pathology; OVARIES INTACT  . CARPAL TUNNEL RELEASE     Bilateral  . COLONOSCOPY  03/07/2004   colon polyps single.  [place].  . TONSILLECTOMY AND ADENOIDECTOMY      History  Smoking Status  . Never Smoker  Smokeless Tobacco  . Never Used    History  Alcohol Use  . 0.0 oz/week    Comment: Occassional use: <3 drinks/month    Family History  Problem Relation Age of Onset  . Hyperlipidemia Mother   . Hypertension Mother   . Hypertension Father   . Coronary artery disease Father 75    stents  . Hyperlipidemia Father     Review of Systems: As noted in history of present illness.  All other systems were reviewed and are negative.  Physical Exam: BP 110/70   Pulse 80   Ht 5' (1.524 m)   Wt 176 lb (79.8 kg)   BMI 34.37 kg/m  She is a pleasant, overweight white female in no acute distress. HEENT: Normal.  no JVD or bruits. Lungs: Clear Cardiovascular: Regular rate and rhythm, normal S1 and S2, no gallop or murmur. Abdomen: Soft and nontender. No masses or bruits. Bowel sounds are positive. Extremities: No cyanosis or edema. Pulses are 2+ and symmetric. Skin: Warm and  dry Neuro: Alert and oriented x3. Cranial nerves II through XII are intact.  LABORATORY DATA: Lab Results  Component Value Date   WBC 5.5 03/17/2016   HGB 13.2 07/11/2014   HCT 41.5 03/17/2016   PLT 280 03/17/2016   GLUCOSE 97 03/17/2016   CHOL 244 (H) 03/17/2016   TRIG 255 (H) 03/17/2016   HDL 34 (L) 03/17/2016   LDLCALC 159 (H) 03/17/2016   ALT 42 (H) 03/17/2016   AST 31 03/17/2016   NA 142 03/17/2016   K 3.9 03/17/2016   CL 101 03/17/2016   CREATININE 0.65 03/17/2016  BUN 11 03/17/2016   CO2 24 03/17/2016   TSH 0.83 05/26/2016   INR 0.89 12/12/2010   HGBA1C 5.5 06/01/2016   ECG 06/01/16 demonstrates sinus bradycardia with a rate of 57 beats per minute. There is  minor nonspecific ST abnormality. I have personally reviewed and interpreted this study.   Assessment / Plan: 1. Hypertension-control is excellent today. Continue current medication.  Continue low-sodium diet.  2. Atypical chest pain. Normal cardiac catheterization October 2012. Recent symptoms may be related to use of steroids. Overall cardiac risk is low based on prior evaluation. Will continue to monitor once she is able to come off steroids.   3. History of PVCs. Minimally symptomatic. Continue low dose metoprolol. Will defer further monitoring for now.

## 2016-07-04 ENCOUNTER — Ambulatory Visit (INDEPENDENT_AMBULATORY_CARE_PROVIDER_SITE_OTHER): Payer: BLUE CROSS/BLUE SHIELD | Admitting: Cardiology

## 2016-07-04 ENCOUNTER — Encounter: Payer: Self-pay | Admitting: Cardiology

## 2016-07-04 VITALS — BP 110/70 | HR 80 | Ht 60.0 in | Wt 176.0 lb

## 2016-07-04 DIAGNOSIS — R002 Palpitations: Secondary | ICD-10-CM

## 2016-07-04 DIAGNOSIS — R0789 Other chest pain: Secondary | ICD-10-CM | POA: Diagnosis not present

## 2016-07-04 NOTE — Patient Instructions (Signed)
Continue your current therapy  We will see how things are once off steroids.

## 2016-07-05 NOTE — Telephone Encounter (Signed)
I have made multiple attempts to reach patient to schedule nurse visit for Stelara without success.  I have sent patient a MyChart message informing her that her Marcy Panning was delivered to the clinic and advised her to call our office to schedule nurse visit.    Lilla Shook, Pharm.D., BCPS, CPP Clinical Pharmacist Pager: [phone] Phone: [phone] 07/05/2016 1:53 PM

## 2016-07-19 ENCOUNTER — Telehealth: Payer: Self-pay | Admitting: Pharmacist

## 2016-07-19 ENCOUNTER — Other Ambulatory Visit: Payer: Self-pay | Admitting: Rheumatology

## 2016-07-19 NOTE — Telephone Encounter (Signed)
Received refill request for patient's Stelara from the pharmacy.  We have not been able to schedule patient's initial Stelara injection and still have one Stelara pen remaining in the refrigerator in the clinic.   I attempted to call patient again regarding Stelara.  There was no answer.  I left a message on patient's voicemail asking her to call us back.    Lilla Shookachel Shavy Beachem, Pharm.D., BCPS, CPP Clinical Pharmacist Pager: [phone] Phone: [phone] 07/19/2016 5:05 PM

## 2016-07-20 ENCOUNTER — Encounter: Payer: Self-pay | Admitting: Gynecology

## 2016-08-13 ENCOUNTER — Other Ambulatory Visit: Payer: Self-pay | Admitting: Rheumatology

## 2016-08-15 DIAGNOSIS — M5136 Other intervertebral disc degeneration, lumbar region: Secondary | ICD-10-CM | POA: Insufficient documentation

## 2016-08-15 DIAGNOSIS — M19041 Primary osteoarthritis, right hand: Secondary | ICD-10-CM | POA: Insufficient documentation

## 2016-08-15 DIAGNOSIS — M19042 Primary osteoarthritis, left hand: Secondary | ICD-10-CM

## 2016-08-15 DIAGNOSIS — M19072 Primary osteoarthritis, left ankle and foot: Secondary | ICD-10-CM

## 2016-08-15 DIAGNOSIS — M19071 Primary osteoarthritis, right ankle and foot: Secondary | ICD-10-CM | POA: Insufficient documentation

## 2016-08-15 DIAGNOSIS — L409 Psoriasis, unspecified: Secondary | ICD-10-CM | POA: Insufficient documentation

## 2016-08-15 NOTE — Progress Notes (Deleted)
Office Visit Note  Patient: Tammy [name]             Date of Birth: April 20, 1955           MRN: [id number]             PCP: Valentino NoseBoswell, Nathan, MD Referring: Valentino NoseBoswell, Nathan, MD Visit Date: 08/18/2016 Occupation: @GUAROCC @    Subjective:  No chief complaint on file.   History of Present Illness: Tammy [name] is a 61 y.o. female ***   Activities of Daily Living:  Patient reports morning stiffness for *** {minute/hour:19697}.   Patient {ACTIONS;DENIES/REPORTS:21021675::"Denies"} nocturnal pain.  Difficulty dressing/grooming: {ACTIONS;DENIES/REPORTS:21021675::"Denies"} Difficulty climbing stairs: {ACTIONS;DENIES/REPORTS:21021675::"Denies"} Difficulty getting out of chair: {ACTIONS;DENIES/REPORTS:21021675::"Denies"} Difficulty using hands for taps, buttons, cutlery, and/or writing: {ACTIONS;DENIES/REPORTS:21021675::"Denies"}   No Rheumatology ROS completed.   PMFS History:  Patient Active Problem List   Diagnosis Date Noted  . Primary osteoarthritis of both hands 08/15/2016  . Primary osteoarthritis of both feet 08/15/2016  . DDD (degenerative disc disease), lumbar 08/15/2016  . Psoriasis 08/15/2016  . Chest pain 06/02/2016  . Prediabetes 06/01/2016  . Transaminitis 04/25/2016  . Low back pain 04/25/2016  . History of anemia 03/17/2016  . Health care maintenance 03/17/2016  . Psoriatic arthritis (HCC) 07/20/2015  . Interstitial cystitis 07/20/2015  . Palpitations 08/13/2012  . Insomnia 08/13/2012  . Hyperlipidemia 08/13/2012  . Essential hypertension, benign 02/28/2012  . Cervical dysplasia 01/26/2012  . Colon polyp 01/26/2012  . Pulmonary nodule 11/27/2011  . Obstructive sleep apnea-question of 11/27/2011  . ESOPHAGEAL STRICTURE 12/05/2007  . GERD 12/05/2007    Past Medical History:  Diagnosis Date  . Abnormal Papanicolaou smear of cervix with positive human papilloma virus (HPV) test 04/2016  . Allergic rhinitis, cause unspecified   . Carpal tunnel  syndrome   . Chronic interstitial cystitis   . Colon polyps 03/07/2004  . Diaphragmatic hernia without mention of obstruction or gangrene   . Dysplasia of cervix, unspecified   . Esophageal reflux   . Essential hypertension, benign   . Gastric ulcer, unspecified as acute or chronic, without mention of hemorrhage, perforation, or obstruction   . Hiatal hernia   . Hypertension   . Insomnia, unspecified   . Irritable bowel syndrome   . Lung nodule 11/27/2011   CXR 11/14/2011-nodule versus osteophyte projecting over thoracic spine on lateral view CT chest 11/24/2011- confirms osteophyte with 2 small nodules right lower lobe and left lower lobe. CT 06/01/12- stable for 5 years.  No further follow-up necessary.   . Methicillin resistant Staphylococcus aureus in conditions classified elsewhere and of unspecified site   . Mixed incontinence urge and stress (female)(female)   . Personal history of colonic polyps   . Pure hypercholesterolemia     Family History  Problem Relation Age of Onset  . Hyperlipidemia Mother   . Hypertension Mother   . Hypertension Father   . Coronary artery disease Father 75       stents  . Hyperlipidemia Father    Past Surgical History:  Procedure Laterality Date  . ABDOMINAL HYSTERECTOMY  03/07/2000   Myoma with abnormal uterine bleeding; benign pathology; OVARIES INTACT  . CARPAL TUNNEL RELEASE     Bilateral  . COLONOSCOPY  03/07/2004   colon polyps single.  [place].  . TONSILLECTOMY AND ADENOIDECTOMY     Social History   Social History Narrative   Marital status:  Married since 1976; happy; no abuse. Husband on long term disability.       Lives  with: husband; Pt's daughter and grandchild also live in the home.        Children:  2 children, 1 stepchild and 5 grandchildren.        Exercise:  None       Objective: Vital Signs: There were no vitals taken for this visit.   Physical Exam   Musculoskeletal Exam: ***  CDAI Exam: No CDAI exam completed.     Investigation: No additional findings. CBC Latest Ref Rng & Units 03/17/2016 07/11/2014 12/18/2013  WBC 3.4 - 10.8 x10E3/uL 5.5 5.1 5.0  Hemoglobin 11.1 - 15.9 g/dL 16.1 09.6 04.5  Hematocrit 34.0 - 46.6 % 41.5 40.0 38.3  Platelets 150 - 379 x10E3/uL 280 272 291    CMP Latest Ref Rng & Units 03/17/2016 07/11/2014 12/18/2013  Glucose 65 - 99 mg/dL 97 86 98  BUN 8 - 27 mg/dL 11 19 17   Creatinine 0.57 - 1.00 mg/dL 4.09 8.11 9.14  Sodium 134 - 144 mmol/L 142 140 140  Potassium 3.5 - 5.2 mmol/L 3.9 3.5 3.8  Chloride 96 - 106 mmol/L 101 101 103  CO2 18 - 29 mmol/L 24 33(H) 28  Calcium 8.7 - 10.3 mg/dL 9.1 8.9 9.6  Total Protein 6.0 - 8.5 g/dL 6.5 7.1 7.1  Total Bilirubin 0.0 - 1.2 mg/dL 0.4 0.6 0.8  Alkaline Phos 39 - 117 IU/L 65 54 62  AST 0 - 40 IU/L 31 17 26   ALT 0 - 32 IU/L 42(H) 18 34    Imaging: No results found.  Speciality Comments: No specialty comments available.    Procedures:  No procedures performed Allergies: Lisinopril and Septra ds [sulfamethoxazole-trimethoprim]   Assessment / Plan:     Visit Diagnoses: Psoriatic arthritis (HCC)  Psoriasis  Primary osteoarthritis of both hands  Primary osteoarthritis of both feet  DDD (degenerative disc disease), lumbar  High risk medication use  History of cystitis  History of gastroesophageal reflux (GERD)  History of hyperlipidemia  Primary insomnia  History of sleep apnea  History of anemia  Chronic midline low back pain without sciatica  History of hypertension  History of palpitations  History of prediabetes    Orders: No orders of the defined types were placed in this encounter.  No orders of the defined types were placed in this encounter.   Face-to-face time spent with patient was *** minutes. 50% of time was spent in counseling and coordination of care.  Follow-Up Instructions: No Follow-up on file.   Becci Batty, RT  Note - This record has been created using [employer].   Chart creation errors have been sought, but may not always  have been located. Such creation errors do not reflect on  the standard of medical care.

## 2016-08-15 NOTE — Telephone Encounter (Signed)
Last Visit:

## 2016-08-18 ENCOUNTER — Ambulatory Visit: Payer: BLUE CROSS/BLUE SHIELD | Admitting: Rheumatology

## 2016-09-24 ENCOUNTER — Other Ambulatory Visit: Payer: Self-pay | Admitting: Internal Medicine

## 2016-09-24 DIAGNOSIS — I10 Essential (primary) hypertension: Secondary | ICD-10-CM

## 2016-09-26 NOTE — Telephone Encounter (Signed)
Patient needs follow up with me

## 2016-09-27 NOTE — Telephone Encounter (Signed)
Needs appt w/ dr Karma Greaserboswell, per dr Karma Greaserboswell

## 2016-11-01 ENCOUNTER — Telehealth: Payer: Self-pay | Admitting: Internal Medicine

## 2016-11-02 ENCOUNTER — Encounter: Payer: BLUE CROSS/BLUE SHIELD | Admitting: Internal Medicine

## 2016-11-10 ENCOUNTER — Ambulatory Visit: Payer: Self-pay

## 2016-11-18 ENCOUNTER — Ambulatory Visit: Payer: Self-pay

## 2016-11-25 ENCOUNTER — Telehealth: Payer: Self-pay | Admitting: Internal Medicine

## 2016-11-25 NOTE — Telephone Encounter (Signed)
CALLED PATIENT PHONE NOT IN SERVICE, EMAILED PATIENT TO CALL AND RESCHEDULE HER APPT. TO APPLY FOR [facility] CARD.

## 2017-05-30 ENCOUNTER — Other Ambulatory Visit: Payer: Self-pay | Admitting: Internal Medicine

## 2017-08-01 ENCOUNTER — Encounter: Payer: Self-pay | Admitting: Family Medicine

## 2017-09-03 ENCOUNTER — Encounter: Payer: Self-pay | Admitting: *Deleted

## 2018-04-09 ENCOUNTER — Telehealth: Payer: Self-pay | Admitting: Internal Medicine

## 2018-04-09 NOTE — Telephone Encounter (Signed)
Patient has not been seen since February 2018.  Called to see if she would still like to be a patient, but no answer.  Left detailed message asking her to please call back and schedule an appt or if she has transferred care please still give [place] a call to let us know.

## 2018-06-06 ENCOUNTER — Encounter: Payer: Self-pay | Admitting: Internal Medicine

## 2019-03-14 ENCOUNTER — Ambulatory Visit: Payer: BC Managed Care – PPO | Attending: Internal Medicine

## 2019-03-14 DIAGNOSIS — Z20822 Contact with and (suspected) exposure to covid-19: Secondary | ICD-10-CM

## 2019-03-16 LAB — NOVEL CORONAVIRUS, NAA: SARS-CoV-2, NAA: DETECTED — AB

## 2019-05-16 ENCOUNTER — Ambulatory Visit: Payer: BC Managed Care – PPO

## 2019-05-27 DIAGNOSIS — R0609 Other forms of dyspnea: Secondary | ICD-10-CM | POA: Insufficient documentation

## 2019-05-27 DIAGNOSIS — J984 Other disorders of lung: Secondary | ICD-10-CM | POA: Insufficient documentation

## 2020-11-06 DIAGNOSIS — F411 Generalized anxiety disorder: Secondary | ICD-10-CM | POA: Insufficient documentation

## 2020-11-06 DIAGNOSIS — F900 Attention-deficit hyperactivity disorder, predominantly inattentive type: Secondary | ICD-10-CM | POA: Insufficient documentation

## 2020-11-06 DIAGNOSIS — F330 Major depressive disorder, recurrent, mild: Secondary | ICD-10-CM | POA: Insufficient documentation

## 2020-12-24 ENCOUNTER — Ambulatory Visit (INDEPENDENT_AMBULATORY_CARE_PROVIDER_SITE_OTHER): Payer: Medicare Other | Admitting: Orthopaedic Surgery

## 2020-12-24 ENCOUNTER — Other Ambulatory Visit: Payer: Self-pay

## 2020-12-24 ENCOUNTER — Encounter: Payer: Self-pay | Admitting: Orthopaedic Surgery

## 2020-12-24 DIAGNOSIS — M25551 Pain in right hip: Secondary | ICD-10-CM

## 2020-12-24 MED ORDER — PREDNISONE 10 MG (21) PO TBPK: ORAL_TABLET | ORAL | 0 refills | Status: DC

## 2020-12-24 NOTE — Progress Notes (Signed)
Office Visit Note   Patient: Tammy [name]           Date of Birth: 1955/12/22           MRN: [id number] Visit Date: 12/24/2020              Requested by: No referring provider defined for this encounter. PCP: No primary care provider on file.   Assessment & Plan: Visit Diagnoses:  1. Pain in right hip     Plan: Impression is right hip pain concerning for questionable labral pathology.  I do not think this is a hamstring injury as she is no longer experiencing posterior thigh pain.  I would like to refer her to Dr. Alvester Morin for intra-articular cortisone injection to the right hip joint.  The pain she is experiencing right now, we have discussed starting her on a steroid taper which she is agreeable to.  She will follow-up with [place] as needed.  Follow-Up Instructions: Return if symptoms worsen or fail to improve.   Orders:  No orders of the defined types were placed in this encounter.  Meds ordered this encounter  Medications   predniSONE (STERAPRED UNI-PAK 21 TAB) 10 MG (21) TBPK tablet    Sig: Take as directed    Dispense:  21 tablet    Refill:  0      Procedures: No procedures performed   Clinical Data: No additional findings.   Subjective: Chief Complaint  Patient presents with   Right Hip - Pain    HPI patient is a pleasant 65 year old female previous patient of Dr. Prince Rome who comes in today with right hip pain.  she was stepping up on a stool this past Sunday to get something off the top of the counter when she noticed pain to the posterior thigh.  This pain shortly subsided but she then began having pain to the groin.  This has not improved over the past several days.  Pain is worse with ambulation as well as lifting her leg to go up stairs.  She was seen by her PCP a few days ago where x-rays of the hip were ordered.  She was also started on Norco which has minimally helped her symptoms.  She denies any paresthesias.  Of note, she is on Skyrizi for psoriatic  arthritis.  No previous hip injection.  Review of Systems as detailed in HPI.  All others reviewed and are negative.   Objective: Vital Signs: There were no vitals taken for this visit.  Physical Exam well-developed well-nourished female no acute distress.  Alert and oriented x3.  Ortho Exam examination of the right hip reveals a markedly positive logroll.  Positive FADIR.  Positive Stinchfield.  She has increased pain with resisted hip flexion.  Negative straight leg raise.  Minimal pain with resisted knee flexion.  No pain to the proximal hamstring attachment.  She has neurovascular intact distally.  Specialty Comments:  No specialty comments available.  Imaging: X-rays of the right hip reviewed by me in canopy show mild degenerative changes to the right hip.  No other acute findings.   PMFS History: Patient Active Problem List   Diagnosis Date Noted   Primary osteoarthritis of both hands 08/15/2016   Primary osteoarthritis of both feet 08/15/2016   DDD (degenerative disc disease), lumbar 08/15/2016   Psoriasis 08/15/2016   Chest pain 06/02/2016   Prediabetes 06/01/2016   Transaminitis 04/25/2016   Low back pain 04/25/2016   History of anemia 03/17/2016  Health care maintenance 03/17/2016   Psoriatic arthritis (HCC) 07/20/2015   Interstitial cystitis 07/20/2015   Palpitations 08/13/2012   Insomnia 08/13/2012   Hyperlipidemia 08/13/2012   Essential hypertension, benign 02/28/2012   Cervical dysplasia 01/26/2012   Colon polyp 01/26/2012   Pulmonary nodule 11/27/2011   Obstructive sleep apnea-question of 11/27/2011   ESOPHAGEAL STRICTURE 12/05/2007   GERD 12/05/2007   Past Medical History:  Diagnosis Date   Abnormal Papanicolaou smear of cervix with positive human papilloma virus (HPV) test 04/2016   Allergic rhinitis, cause unspecified    Carpal tunnel syndrome    Chronic interstitial cystitis    Colon polyps 03/07/2004   Diaphragmatic hernia without mention of  obstruction or gangrene    Dysplasia of cervix, unspecified    Esophageal reflux    Essential hypertension, benign    Gastric ulcer, unspecified as acute or chronic, without mention of hemorrhage, perforation, or obstruction    Hiatal hernia    Hypertension    Insomnia, unspecified    Irritable bowel syndrome    Lung nodule 11/27/2011   CXR 11/14/2011-nodule versus osteophyte projecting over thoracic spine on lateral view CT chest 11/24/2011- confirms osteophyte with 2 small nodules right lower lobe and left lower lobe. CT 06/01/12- stable for 5 years.  No further follow-up necessary.    Methicillin resistant Staphylococcus aureus in conditions classified elsewhere and of unspecified site    Mixed incontinence urge and stress (female)(female)    Personal history of colonic polyps    Pure hypercholesterolemia     Family History  Problem Relation Age of Onset   Hyperlipidemia Mother    Hypertension Mother    Hypertension Father    Coronary artery disease Father 7       stents   Hyperlipidemia Father     Past Surgical History:  Procedure Laterality Date   ABDOMINAL HYSTERECTOMY  03/07/2000   Myoma with abnormal uterine bleeding; benign pathology; OVARIES INTACT   CARPAL TUNNEL RELEASE     Bilateral   COLONOSCOPY  03/07/2004   colon polyps single.  [place].   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

## 2020-12-25 ENCOUNTER — Other Ambulatory Visit: Payer: Self-pay

## 2020-12-25 DIAGNOSIS — M25551 Pain in right hip: Secondary | ICD-10-CM

## 2021-01-13 ENCOUNTER — Encounter: Payer: Self-pay | Admitting: Physical Medicine and Rehabilitation

## 2021-01-13 ENCOUNTER — Other Ambulatory Visit: Payer: Self-pay

## 2021-01-13 ENCOUNTER — Ambulatory Visit: Payer: Self-pay

## 2021-01-13 ENCOUNTER — Ambulatory Visit (INDEPENDENT_AMBULATORY_CARE_PROVIDER_SITE_OTHER): Payer: Medicare Other | Admitting: Physical Medicine and Rehabilitation

## 2021-01-13 DIAGNOSIS — M25551 Pain in right hip: Secondary | ICD-10-CM

## 2021-01-13 NOTE — Progress Notes (Signed)
Right hip pain worse with walking. Patient states pain started in inner right thigh and is now in right buttock and down posterior thigh. Numeric Pain Rating Scale and Functional Assessment Average Pain 8   In the last MONTH (on 0-10 scale) has pain interfered with the following?  1. General activity like being  able to carry out your everyday physical activities such as walking, climbing stairs, carrying groceries, or moving a chair?  Rating(5)   -Dye Allergies.

## 2021-01-17 MED ORDER — BUPIVACAINE HCL 0.25 % IJ SOLN
4.0000 mL | INTRAMUSCULAR | Status: AC | PRN
  Administered 2021-01-13: 4 mL via INTRA_ARTICULAR

## 2021-01-17 MED ORDER — TRIAMCINOLONE ACETONIDE 40 MG/ML IJ SUSP
60.0000 mg | INTRAMUSCULAR | Status: AC | PRN
Start: 2021-01-13 — End: 2021-01-13
  Administered 2021-01-13: 60 mg via INTRA_ARTICULAR

## 2021-01-17 NOTE — Progress Notes (Signed)
   Kayin DEEPA BARTHEL - 65 y.o. female MRN [id number]  Date of birth: 1955/09/24  Office Visit Note: Visit Date: 01/13/2021 PCP: No primary care provider on file. Referred by: Tarry Kos, MD  Subjective: Chief Complaint  Patient presents with   Right Hip - Pain   HPI:  Glendell P Finder is a 65 y.o. female who comes in today at the request of Dr. Glee Arvin for planned Right anesthetic hip arthrogram with fluoroscopic guidance.  The patient has failed conservative care including home exercise, medications, time and activity modification.  This injection will be diagnostic and hopefully therapeutic.  Please see requesting physician notes for further details and justification.  ROS Otherwise per HPI.  Assessment & Plan: Visit Diagnoses:    ICD-10-CM   1. Pain in right hip  M25.551 XR C-ARM NO REPORT      Plan: No additional findings.   Meds & Orders: No orders of the defined types were placed in this encounter.   Orders Placed This Encounter  Procedures   Large Joint Inj   XR C-ARM NO REPORT    Follow-up: Return for visit to requesting provider as needed.   Procedures: Large Joint Inj: R hip joint on 01/13/2021 1:15 PM Indications: diagnostic evaluation and pain Details: 22 G 3.5 in needle, fluoroscopy-guided anterior approach  Arthrogram: No  Medications: 4 mL bupivacaine 0.25 %; 60 mg triamcinolone acetonide 40 MG/ML Outcome: tolerated well, no immediate complications  There was excellent flow of contrast producing a partial arthrogram of the hip. The patient did have relief of symptoms during the anesthetic phase of the injection. Procedure, treatment alternatives, risks and benefits explained, specific risks discussed. Consent was given by the patient. Immediately prior to procedure a time out was called to verify the correct patient, procedure, equipment, support staff and site/side marked as required. Patient was prepped and draped in the usual sterile fashion.          Clinical History: No specialty comments available.     Objective:  VS:  HT:    WT:   BMI:     BP:   HR: bpm  TEMP: ( )  RESP:  Physical Exam   Imaging: No results found.

## 2021-02-19 ENCOUNTER — Telehealth: Payer: Self-pay | Admitting: Physical Medicine and Rehabilitation

## 2021-02-19 NOTE — Telephone Encounter (Signed)
Pt states she would like another right hip injection. She said she got relief from injection but she was doing lots of housework. She thinks she may have overdid it.   Cb [phone]

## 2021-02-19 NOTE — Telephone Encounter (Signed)
Please advise. Is PA needed?

## 2021-02-22 ENCOUNTER — Telehealth: Payer: Self-pay | Admitting: Physical Medicine and Rehabilitation

## 2021-02-22 NOTE — Telephone Encounter (Signed)
Pt would like someone to call her about the pain she is experiencing.   CB [phone]

## 2021-02-23 ENCOUNTER — Other Ambulatory Visit: Payer: Self-pay

## 2021-02-23 ENCOUNTER — Ambulatory Visit: Payer: Self-pay

## 2021-02-23 ENCOUNTER — Ambulatory Visit (INDEPENDENT_AMBULATORY_CARE_PROVIDER_SITE_OTHER): Payer: Medicare Other | Admitting: Physical Medicine and Rehabilitation

## 2021-02-23 DIAGNOSIS — M25551 Pain in right hip: Secondary | ICD-10-CM

## 2021-02-23 NOTE — Progress Notes (Signed)
Pt state right hip pain. Pt state sitting and getting up from a chair makes the pain worse. Pt state she takes pain meds to help ease her pain.   Numeric Pain Rating Scale and Functional Assessment Average Pain 5   In the last MONTH (on 0-10 scale) has pain interfered with the following?  1. General activity like being  able to carry out your everyday physical activities such as walking, climbing stairs, carrying groceries, or moving a chair?  Rating(10)   -BT, -Dye Allergies.

## 2021-02-23 NOTE — Progress Notes (Signed)
Tammy [name] - 65 y.o. female MRN [id number]  Date of birth: [birth year]  Office Visit Note: Visit Date: 02/23/2021 PCP: Sherrie Mustache T, PA-C Referred by: No ref. provider found  Subjective: No chief complaint on file.  HPI:  Tammy [name] is a 65 y.o. female who comes in todayAbout 6 weeks status post intra-articular right hip injection that was performed at the request of Dr. Glee Arvin and his physician assistant Tessa Lerner, PA-C.  She reports dramatic relief for about a week and she definitely had relief during the anesthetic phase of the injection that we documented.  Unfortunately she was doing some moving up and down on some stairs and ladders and general activity and feels like the symptoms have gone back to basically square 1 she is having severe right hip and groin pain.  Interestingly she called into the office though requesting an injection for both right and left sides with pain into the hips and legs and feet in general.  She is following up with Dr. Glee Arvin tomorrow for evaluation.  Through the messaging system I did agree to repeat the right hip injection on 1 occasion at the 6-week mark just to see if she gets more sustained relief through the holidays.  Diagnostically she did do well with the injection that just did not last long enough.  She obtained about 5 weeks of relief to some degree but the symptoms returned.  Her case is complicated by psoriatic arthritis for which she is followed by rheumatology.  She is actually seen Dr. Melvenia Needles in the past.  She also has a history of depression and anxiety and history of interstitial cystitis, irritable bowel syndrome and insomnia but no formal diagnosis of fibromyalgia.  I did repeat the hip injection today and diagnostically she did get relief during the anesthetic phase.  She would need to wait at least 3 months for another injection into the hip.  ROS Otherwise per HPI.  Assessment & Plan: Visit  Diagnoses:    ICD-10-CM   1. Pain in right hip  M25.551 Large Joint Inj: R hip joint    XR C-ARM NO REPORT      Plan: No additional findings.   Meds & Orders: No orders of the defined types were placed in this encounter.   Orders Placed This Encounter  Procedures   Large Joint Inj: R hip joint   XR C-ARM NO REPORT    Follow-up: Return for  Glee Arvin, MD tomorrow as scheduled.   Procedures: Large Joint Inj: R hip joint on 02/23/2021 11:06 AM Indications: diagnostic evaluation and pain Details: 22 G 3.5 in needle, fluoroscopy-guided anterior approach  Arthrogram: No  Medications: 4 mL bupivacaine 0.25 %; 60 mg triamcinolone acetonide 40 MG/ML Outcome: tolerated well, no immediate complications  There was excellent flow of contrast producing a partial arthrogram of the hip. The patient did have relief of symptoms during the anesthetic phase of the injection. Procedure, treatment alternatives, risks and benefits explained, specific risks discussed. Consent was given by the patient. Immediately prior to procedure a time out was called to verify the correct patient, procedure, equipment, support staff and site/side marked as required. Patient was prepped and draped in the usual sterile fashion.         Clinical History: No specialty comments available.     Objective:  VS:  HT:     WT:    BMI:      BP:    HR: bpm  TEMP: ( )   RESP:  Physical Exam   Imaging: XR C-ARM NO REPORT  Result Date: 02/23/2021 Please see Notes tab for imaging impression.

## 2021-02-24 ENCOUNTER — Encounter: Payer: Self-pay | Admitting: Orthopaedic Surgery

## 2021-02-24 ENCOUNTER — Ambulatory Visit: Payer: Self-pay

## 2021-02-24 ENCOUNTER — Ambulatory Visit: Payer: Medicare Other | Admitting: Orthopaedic Surgery

## 2021-02-24 DIAGNOSIS — G8929 Other chronic pain: Secondary | ICD-10-CM | POA: Diagnosis not present

## 2021-02-24 DIAGNOSIS — M545 Low back pain, unspecified: Secondary | ICD-10-CM | POA: Diagnosis not present

## 2021-02-24 MED ORDER — TRIAMCINOLONE ACETONIDE 40 MG/ML IJ SUSP
60.0000 mg | INTRAMUSCULAR | Status: AC | PRN
Start: 2021-02-23 — End: 2021-02-23
  Administered 2021-02-23: 11:00:00 60 mg via INTRA_ARTICULAR

## 2021-02-24 MED ORDER — BUPIVACAINE HCL 0.25 % IJ SOLN
4.0000 mL | INTRAMUSCULAR | Status: AC | PRN
  Administered 2021-02-23: 11:00:00 4 mL via INTRA_ARTICULAR

## 2021-02-25 NOTE — Progress Notes (Signed)
Office Visit Note   Patient: Tammy [name]           Date of Birth: 26-Aug-1955           MRN: [id number] Visit Date: 02/24/2021              Requested by: No referring provider defined for this encounter. PCP: Virgilio Belling, PA-C   Assessment & Plan: Visit Diagnoses:  1. Chronic midline low back pain without sciatica     Plan: Ms. Pelster returns today for continued bilateral leg pain.  She states that right hip injection by Dr. Alvester Morin helped but she continues to feel pain radiating down both legs.  She has slight lower back pain.  She has noticed some tingling in her legs and pain in the thighs and knees.  Lumbar spine exam shows no palpable deformities.  Hip exam is benign.  No sciatic tension signs.  No focal motor or sensory deficits of the lower extremities.  X-rays show degenerative spondylolisthesis at L4-5 as well as diffuse spondylosis.  The injection only helped partially with the hip pain therefore at this point we will need to send her for an MRI of the lumbar spine to evaluate for structural abnormalities.  She is also agreeable to a referral to outpatient physical therapy.  We will notify her of the results of the MRI and likely send a referral to Dr. Alvester Morin for [facility].  Follow-Up Instructions: No follow-ups on file.   Orders:  Orders Placed This Encounter  Procedures   XR Lumbar Spine 2-3 Views   MR Lumbar Spine w/o contrast   Ambulatory referral to Physical Therapy   No orders of the defined types were placed in this encounter.     Procedures: No procedures performed   Clinical Data: No additional findings.   Subjective: Chief Complaint  Patient presents with   Right Leg - Follow-up   Left Leg - New Patient (Initial Visit)    HPI  Review of Systems  Constitutional: Negative.   HENT: Negative.    Eyes: Negative.   Respiratory: Negative.    Cardiovascular: Negative.   Endocrine: Negative.   Musculoskeletal: Negative.   Neurological:  Negative.   Hematological: Negative.   Psychiatric/Behavioral: Negative.    All other systems reviewed and are negative.   Objective: Vital Signs: There were no vitals taken for this visit.  Physical Exam Vitals and nursing note reviewed.  Constitutional:      Appearance: She is well-developed.  Pulmonary:     Effort: Pulmonary effort is normal.  Skin:    General: Skin is warm.     Capillary Refill: Capillary refill takes less than 2 seconds.  Neurological:     Mental Status: She is alert and oriented to person, place, and time.  Psychiatric:        Behavior: Behavior normal.        Thought Content: Thought content normal.        Judgment: Judgment normal.    Ortho Exam  Specialty Comments:  No specialty comments available.  Imaging: No results found.   PMFS History: Patient Active Problem List   Diagnosis Date Noted   Primary osteoarthritis of both hands 08/15/2016   Primary osteoarthritis of both feet 08/15/2016   DDD (degenerative disc disease), lumbar 08/15/2016   Psoriasis 08/15/2016   Chest pain 06/02/2016   Prediabetes 06/01/2016   Transaminitis 04/25/2016   Low back pain 04/25/2016   History of anemia 03/17/2016   Health  care maintenance 03/17/2016   Psoriatic arthritis (HCC) 07/20/2015   Interstitial cystitis 07/20/2015   Palpitations 08/13/2012   Insomnia 08/13/2012   Hyperlipidemia 08/13/2012   Essential hypertension, benign 02/28/2012   Cervical dysplasia 01/26/2012   Colon polyp 01/26/2012   Pulmonary nodule 11/27/2011   Obstructive sleep apnea-question of 11/27/2011   ESOPHAGEAL STRICTURE 12/05/2007   GERD 12/05/2007   Past Medical History:  Diagnosis Date   Abnormal Papanicolaou smear of cervix with positive human papilloma virus (HPV) test 04/2016   Allergic rhinitis, cause unspecified    Carpal tunnel syndrome    Chronic interstitial cystitis    Colon polyps 03/07/2004   Diaphragmatic hernia without mention of obstruction or gangrene     Dysplasia of cervix, unspecified    Esophageal reflux    Essential hypertension, benign    Gastric ulcer, unspecified as acute or chronic, without mention of hemorrhage, perforation, or obstruction    Hiatal hernia    Hypertension    Insomnia, unspecified    Irritable bowel syndrome    Lung nodule 11/27/2011   CXR 11/14/2011-nodule versus osteophyte projecting over thoracic spine on lateral view CT chest 11/24/2011- confirms osteophyte with 2 small nodules right lower lobe and left lower lobe. CT 06/01/12- stable for 5 years.  No further follow-up necessary.    Methicillin resistant Staphylococcus aureus in conditions classified elsewhere and of unspecified site    Mixed incontinence urge and stress (female)(female)    Personal history of colonic polyps    Pure hypercholesterolemia     Family History  Problem Relation Age of Onset   Hyperlipidemia Mother    Hypertension Mother    Hypertension Father    Coronary artery disease Father 19       stents   Hyperlipidemia Father     Past Surgical History:  Procedure Laterality Date   ABDOMINAL HYSTERECTOMY  03/07/2000   Myoma with abnormal uterine bleeding; benign pathology; OVARIES INTACT   CARPAL TUNNEL RELEASE     Bilateral   COLONOSCOPY  03/07/2004   colon polyps single.  [place].   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

## 2021-02-26 ENCOUNTER — Other Ambulatory Visit: Payer: Self-pay | Admitting: Orthopaedic Surgery

## 2021-02-26 MED ORDER — PREDNISONE 10 MG (21) PO TBPK: ORAL_TABLET | ORAL | 3 refills | Status: DC

## 2021-03-09 ENCOUNTER — Ambulatory Visit
Admission: RE | Admit: 2021-03-09 | Discharge: 2021-03-09 | Disposition: A | Discharge status: Home / Self Care | Payer: Medicare Other | Source: Ambulatory Visit | Attending: Orthopaedic Surgery | Admitting: Orthopaedic Surgery

## 2021-03-09 DIAGNOSIS — M545 Low back pain, unspecified: Secondary | ICD-10-CM

## 2021-03-09 DIAGNOSIS — G8929 Other chronic pain: Secondary | ICD-10-CM

## 2021-03-10 ENCOUNTER — Telehealth: Payer: Self-pay | Admitting: Orthopaedic Surgery

## 2021-03-10 MED ORDER — HYDROCODONE-ACETAMINOPHEN 5-325 MG PO TABS: 1.0000 | ORAL_TABLET | Freq: Every day | ORAL | 0 refills | Status: DC | PRN

## 2021-03-10 NOTE — Telephone Encounter (Signed)
Patient would like to know if you can call her with results. She is leaving Sunday to take care of dad. Will be back end of Feb.   CB Cell # [phone] Or  home # [phone]

## 2021-03-10 NOTE — Telephone Encounter (Signed)
Error

## 2021-03-10 NOTE — Telephone Encounter (Signed)
Spoke to patient. Recommend getting disk from [name] and taking it down to [place].  I also sent #20 of norco which will not be refilled.  She also wants to see if she can get an ESI with [facility] before she leaves but she understands insurance may not approve in time.  She would like referral outpatient PT for HEP for back pain.  Thanks.

## 2021-03-10 NOTE — Telephone Encounter (Signed)
Pt called and needs someone to call her to talk about her MRI.   CB [phone]

## 2021-03-10 NOTE — Addendum Note (Signed)
Addended by: Azucena Cecil on: 03/10/2021 05:15 PM   Modules accepted: Orders

## 2021-03-11 ENCOUNTER — Other Ambulatory Visit: Payer: Self-pay

## 2021-03-11 DIAGNOSIS — M545 Low back pain, unspecified: Secondary | ICD-10-CM

## 2021-03-11 DIAGNOSIS — G8929 Other chronic pain: Secondary | ICD-10-CM

## 2021-03-11 NOTE — Telephone Encounter (Signed)
Done

## 2021-03-15 ENCOUNTER — Ambulatory Visit: Payer: Medicare Other | Admitting: Physical Therapy

## 2021-04-30 ENCOUNTER — Ambulatory Visit: Payer: Medicare Other | Admitting: Rehabilitative and Restorative Service Providers"

## 2021-04-30 NOTE — Therapy (Incomplete)
OUTPATIENT PHYSICAL THERAPY LOWER EXTREMITY EVALUATION   Patient Name: Tammy [name] MRN: [id number] DOB:23-Feb-1956, 66 y.o., female Today's Date: 04/30/2021    Past Medical History:  Diagnosis Date   Abnormal Papanicolaou smear of cervix with positive human papilloma virus (HPV) test 04/2016   Allergic rhinitis, cause unspecified    Carpal tunnel syndrome    Chronic interstitial cystitis    Colon polyps 03/07/2004   Diaphragmatic hernia without mention of obstruction or gangrene    Dysplasia of cervix, unspecified    Esophageal reflux    Essential hypertension, benign    Gastric ulcer, unspecified as acute or chronic, without mention of hemorrhage, perforation, or obstruction    Hiatal hernia    Hypertension    Insomnia, unspecified    Irritable bowel syndrome    Lung nodule 11/27/2011   CXR 11/14/2011-nodule versus osteophyte projecting over thoracic spine on lateral view CT chest 11/24/2011- confirms osteophyte with 2 small nodules right lower lobe and left lower lobe. CT 06/01/12- stable for 5 years.  No further follow-up necessary.    Methicillin resistant Staphylococcus aureus in conditions classified elsewhere and of unspecified site    Mixed incontinence urge and stress (female)(female)    Personal history of colonic polyps    Pure hypercholesterolemia    Past Surgical History:  Procedure Laterality Date   ABDOMINAL HYSTERECTOMY  03/07/2000   Myoma with abnormal uterine bleeding; benign pathology; OVARIES INTACT   CARPAL TUNNEL RELEASE     Bilateral   COLONOSCOPY  03/07/2004   colon polyps single.  [place].   TONSILLECTOMY AND ADENOIDECTOMY     Patient Active Problem List   Diagnosis Date Noted   Primary osteoarthritis of both hands 08/15/2016   Primary osteoarthritis of both feet 08/15/2016   DDD (degenerative disc disease), lumbar 08/15/2016   Psoriasis 08/15/2016   Chest pain 06/02/2016   Prediabetes 06/01/2016   Transaminitis 04/25/2016   Low back pain  04/25/2016   History of anemia 03/17/2016   Health care maintenance 03/17/2016   Psoriatic arthritis ([place]) 07/20/2015   Interstitial cystitis 07/20/2015   Palpitations 08/13/2012   Insomnia 08/13/2012   Hyperlipidemia 08/13/2012   Essential hypertension, benign 02/28/2012   Cervical dysplasia 01/26/2012   Colon polyp 01/26/2012   Pulmonary nodule 11/27/2011   Obstructive sleep apnea-question of 11/27/2011   ESOPHAGEAL STRICTURE 12/05/2007   GERD 12/05/2007    PCP: Andria Frames, PA-C  REFERRING PROVIDER: Leandrew Koyanagi, MD  REFERRING DIAG: M54.50,G89.29 (ICD-10-CM) - Chronic midline low back pain without sciatica   THERAPY DIAG:  No diagnosis found.  ONSET DATE: ***  SUBJECTIVE:   SUBJECTIVE STATEMENT: ***  PERTINENT HISTORY: HTN, hernia, previous abdominal surgery, carpal tunnel release, OA of B hands and feet, pre-diabetes  PAIN:  Are you having pain? {yes/no:20286} NPRS scale: ***/10 Pain location: *** Pain orientation: {Pain Orientation:25161}  PAIN TYPE: {type:313116} Pain description: {PAIN DESCRIPTION:21022940}  Aggravating factors: *** Relieving factors: ***  PRECAUTIONS: Back  WEIGHT BEARING RESTRICTIONS {Yes ***/No:24003}  FALLS:  Has patient fallen in last 6 months? {yes/no:20286}, Number of falls: ***  LIVING ENVIRONMENT: Lives with: {[facility] lives with:25569::"lives with their family"} Lives in: {Lives in:25570} Stairs: {yes/no:20286}; {Stairs:24000} Has following equipment at home: {Assistive devices:23999}  OCCUPATION: ***  PLOF: {PLOF:24004}  PATIENT GOALS ***   OBJECTIVE:   DIAGNOSTIC FINDINGS: ***  PATIENT SURVEYS:  {rehab surveys:24030}  COGNITION:  Overall cognitive status: {cognition:24006}     SENSATION:  Light touch: {intact/deficits:24005}  Stereognosis: {intact/deficits:24005}  Hot/Cold: {intact/deficits:24005}  Proprioception: {  intact/deficits:24005}  MUSCLE LENGTH: Hamstrings: Right *** deg; Left ***  deg Marcello Moores test: Right *** deg; Left *** deg  POSTURE:  ***  PALPATION: ***  LE AROM/PROM:  A/PROM Right 04/30/2021 Left 04/30/2021  Hip flexion    Hip extension    Hip abduction    Hip adduction    Hip internal rotation    Hip external rotation    Knee flexion    Knee extension    Ankle dorsiflexion    Ankle plantarflexion    Ankle inversion    Ankle eversion     (Blank rows = not tested)  LE MMT:  MMT Right 04/30/2021 Left 04/30/2021  Hip flexion    Hip extension    Hip abduction    Hip adduction    Hip internal rotation    Hip external rotation    Knee flexion    Knee extension    Ankle dorsiflexion    Ankle plantarflexion    Ankle inversion    Ankle eversion     (Blank rows = not tested)  LOWER EXTREMITY SPECIAL TESTS:  {LEspecialtests:26242}  FUNCTIONAL TESTS:  {Functional tests:24029}  GAIT: Distance walked: *** Assistive device utilized: {Assistive devices:23999} Level of assistance: {Levels of assistance:24026} Comments: ***    TODAY'S TREATMENT: ***   PATIENT EDUCATION:  Education details: *** Person educated: {Person educated:25204} Education method: {Education Method:25205} Education comprehension: {Education Comprehension:25206}   HOME EXERCISE PROGRAM: ***  ASSESSMENT:  CLINICAL IMPRESSION: Patient is a 66 y.o. FM who was seen today for physical therapy evaluation and treatment for low back pain.    OBJECTIVE IMPAIRMENTS {opptimpairments:25111}.   ACTIVITY LIMITATIONS {activity limitations:25113}.   PERSONAL FACTORS {Personal factors:25162} are also affecting patient's functional outcome.    REHAB POTENTIAL: {rehabpotential:25112}  CLINICAL DECISION MAKING: {clinical decision making:25114}  EVALUATION COMPLEXITY: {Evaluation complexity:25115}   GOALS: Goals reviewed with patient? Yes  SHORT TERM GOALS:  STG Name Target Date Goal status  1 *** Baseline:  {follow up:25551} {GOALSTATUS:25110}  2  *** Baseline:  {follow up:25551} {GOALSTATUS:25110}  3 *** Baseline: {follow up:25551} {GOALSTATUS:25110}  4 *** Baseline: {follow up:25551} {GOALSTATUS:25110}  5 *** Baseline: {follow up:25551} {GOALSTATUS:25110}  6 *** Baseline: {follow up:25551} {GOALSTATUS:25110}  7 *** Baseline: {follow up:25551} {GOALSTATUS:25110}   LONG TERM GOALS:   LTG Name Target Date Goal status  1 Improve FOTO to X. Baseline: X {follow up:25551} {GOALSTATUS:25110}  2 Jeani Hawking will report low back pain consistently < 3/10 on the Numeric Pain Rating Scale. Baseline: Can be X/10 {follow up:25551} {GOALSTATUS:25110}  3 *** Baseline: {follow up:25551} {GOALSTATUS:25110}  4 *** Baseline: {follow up:25551} {GOALSTATUS:25110}  5 Jeani Hawking will be independent with her long-term updated HEP at [place]. Baseline: Started today {follow up:25551} {GOALSTATUS:25110}  6 *** Baseline: {follow up:25551} {GOALSTATUS:25110}  7 *** Baseline: {follow up:25551} {GOALSTATUS:25110}   PLAN: PT FREQUENCY: {rehab frequency:25116}  PT DURATION: {rehab duration:25117}  PLANNED INTERVENTIONS: {rehab planned interventions:25118::"Therapeutic exercises","Therapeutic activity","Neuro Muscular re-education","Balance training","Gait training","Patient/Family education","Joint mobilization"}  PLAN FOR NEXT SESSION: Review HEP.  Practical posture and body mechanics work.   Farley Ly, PT, MPT 04/30/2021, 9:20 AM

## 2021-05-03 ENCOUNTER — Ambulatory Visit: Payer: Medicare Other | Admitting: Physical Medicine and Rehabilitation

## 2021-06-02 ENCOUNTER — Other Ambulatory Visit: Payer: Self-pay

## 2021-06-02 ENCOUNTER — Ambulatory Visit: Payer: Medicare Other | Admitting: Surgery

## 2021-06-02 ENCOUNTER — Telehealth: Payer: Self-pay

## 2021-06-02 ENCOUNTER — Ambulatory Visit: Payer: Self-pay

## 2021-06-02 ENCOUNTER — Encounter: Payer: Self-pay | Admitting: Surgery

## 2021-06-02 DIAGNOSIS — M25561 Pain in right knee: Secondary | ICD-10-CM

## 2021-06-02 DIAGNOSIS — L4050 Arthropathic psoriasis, unspecified: Secondary | ICD-10-CM | POA: Diagnosis not present

## 2021-06-02 MED ORDER — METHYLPREDNISOLONE ACETATE 40 MG/ML IJ SUSP
40.0000 mg | INTRAMUSCULAR | Status: AC | PRN
  Administered 2021-06-02: 40 mg via INTRA_ARTICULAR

## 2021-06-02 MED ORDER — BUPIVACAINE HCL 0.25 % IJ SOLN
6.0000 mL | INTRAMUSCULAR | Status: AC | PRN
  Administered 2021-06-02: 6 mL via INTRA_ARTICULAR

## 2021-06-02 MED ORDER — LIDOCAINE HCL 1 % IJ SOLN
3.0000 mL | INTRAMUSCULAR | Status: AC | PRN
  Administered 2021-06-02: 3 mL

## 2021-06-02 NOTE — Telephone Encounter (Signed)
Patient called wanting to know if she can move around she stated that she was told by a friend after an injection not to move around to much or the medication will not stay where it needs to be ?  ?She also would like to know if she can put ice on her knee ?  ? ?Please advise  ?

## 2021-06-02 NOTE — Progress Notes (Addendum)
? ?Office Visit Note ?  ?Patient: Tammy [name]           ?Date of Birth: 1955-12-03           ?MRN: [id number] ?Visit Date: 06/02/2021 ?             ?Requested by: Andria Frames, PA-C ?[address] ?[place],  [place] [id number] ?PCP: Andria Frames, PA-C ? ? ?Assessment & Plan: ?Visit Diagnoses:  ?1. Right knee pain, unspecified chronicity   ? ? ?Plan: My assistant did call [facility] office and asked what was injected at their urgent care.  Today I agreed to repeat injection.  Had patient sent right knee was prepped with Betadine and an intra-articular Marcaine/Depo-Medrol injection was performed.  I do not get enough fluid to send for analysis.  Also had blood work drawn today to check a CBC and arthritis panel.  Patient will follow-up with Dr.Xu who usually sees her and he can review her labs and discuss further treatment for her knee which includes whether or not she needs referral back to rheumatology. ? ?Follow-Up Instructions: Return in about 2 weeks (around 06/16/2021) for WITH DR Erlinda Hong FOR RIGHT KNEE PAIN AND REVIEW LABS.  ? ?Orders:  ?Orders Placed This Encounter  ?Procedures  ? Large Joint Inj  ? XR KNEE 3 VIEW RIGHT  ? CBC  ? Antinuclear Antib (ANA)  ? Rheumatoid Factor  ? Sed Rate (ESR)  ? Uric acid  ? HLA-B27 antigen  ? ?No orders of the defined types were placed in this encounter. ? ? ? ? Procedures: ?Large Joint Inj: R knee on 06/02/2021 9:44 AM ?Indications: pain ?Details: 22 G 1.5 in needle, superolateral approach ?Medications: 3 mL lidocaine 1 %; 40 mg methylPREDNISolone acetate 40 MG/ML; 6 mL bupivacaine 0.25 % ?Aspirate: serous ?Outcome: tolerated well, no immediate complications ? ?I was only able to aspirate about 3 cc of fluid from the knee and did not think this was enough to send to the lab. ?Consent was given by the patient. Patient was prepped and draped in the usual sterile fashion.  ? ? ? ? ?Clinical Data: ?No additional findings. ? ? ?Subjective: ?Chief Complaint   ?Patient presents with  ? Right Knee - Follow-up  ? ? ?HPI ?66 year old white female who is an established patient with Dr. Erlinda Hong is being seen by me for right knee pain.  Patient states that she is never had any problems with her knee in the past and this past Sunday had sudden onset of right knee pain.  No injury.  Knee painful with weightbearing.  Patient has a documented history of psoriatic arthritis and I reviewed her chart and she has seen to rheumatologist who felt this was the diagnosis but then she stated that she had seen a third rheumatologist who did not agree with the diagnosis of psoriatic arthritis.  She did go to the [facility] urgent care on Sunday and they injected 2 cc of lidocaine and 1 cc of Kenalog.  Patient states that she had very good relief for a couple of days but then her pain returned. ?Review of Systems ?No current cardiopulmonary GI/GU issues ? ?Objective: ?Vital Signs: There were no vitals taken for this visit. ? ?Physical Exam ?HENT:  ?   Head: Normocephalic and atraumatic.  ?   Nose: Nose normal.  ?Eyes:  ?   Extraocular Movements: Extraocular movements intact.  ?Pulmonary:  ?   Effort: No respiratory distress.  ?Musculoskeletal:  ?  Comments: Gait is somewhat antalgic.  Negative log about hips.  Right knee does have some swelling.  Small tender Baker's cyst.  Knee is somewhat diffusely tender.  No signs of infection.  No redness.  Range of motion about 0 to 110 degrees.  Left knee unremarkable.  Bilateral calves nontender.  Neurovascular intact.  ?Neurological:  ?   Mental Status: She is alert and oriented to person, place, and time.  ?Psychiatric:  ?   Comments: Patient very talkative  ? ? ?Ortho Exam ? ?Specialty Comments:  ?No specialty comments available. ? ?Imaging: ?No results found. ? ? ?PMFS History: ?Patient Active Problem List  ? Diagnosis Date Noted  ? Primary osteoarthritis of both hands 08/15/2016  ? Primary osteoarthritis of both feet 08/15/2016  ? DDD  (degenerative disc disease), lumbar 08/15/2016  ? Psoriasis 08/15/2016  ? Chest pain 06/02/2016  ? Prediabetes 06/01/2016  ? Transaminitis 04/25/2016  ? Low back pain 04/25/2016  ? History of anemia 03/17/2016  ? Health care maintenance 03/17/2016  ? Psoriatic arthritis ([place]) 07/20/2015  ? Interstitial cystitis 07/20/2015  ? Palpitations 08/13/2012  ? Insomnia 08/13/2012  ? Hyperlipidemia 08/13/2012  ? Essential hypertension, benign 02/28/2012  ? Cervical dysplasia 01/26/2012  ? Colon polyp 01/26/2012  ? Pulmonary nodule 11/27/2011  ? Obstructive sleep apnea-question of 11/27/2011  ? ESOPHAGEAL STRICTURE 12/05/2007  ? GERD 12/05/2007  ? ?Past Medical History:  ?Diagnosis Date  ? Abnormal Papanicolaou smear of cervix with positive human papilloma virus (HPV) test 04/2016  ? Allergic rhinitis, cause unspecified   ? Carpal tunnel syndrome   ? Chronic interstitial cystitis   ? Colon polyps 03/07/2004  ? Diaphragmatic hernia without mention of obstruction or gangrene   ? Dysplasia of cervix, unspecified   ? Esophageal reflux   ? Essential hypertension, benign   ? Gastric ulcer, unspecified as acute or chronic, without mention of hemorrhage, perforation, or obstruction   ? Hiatal hernia   ? Hypertension   ? Insomnia, unspecified   ? Irritable bowel syndrome   ? Lung nodule 11/27/2011  ? CXR 11/14/2011-nodule versus osteophyte projecting over thoracic spine on lateral view CT chest 11/24/2011- confirms osteophyte with 2 small nodules right lower lobe and left lower lobe. CT 06/01/12- stable for 5 years.  No further follow-up necessary.   ? Methicillin resistant Staphylococcus aureus in conditions classified elsewhere and of unspecified site   ? Mixed incontinence urge and stress (female)(female)   ? Personal history of colonic polyps   ? Pure hypercholesterolemia   ?  ?Family History  ?Problem Relation Age of Onset  ? Hyperlipidemia Mother   ? Hypertension Mother   ? Hypertension Father   ? Coronary artery disease Father 1  ?      stents  ? Hyperlipidemia Father   ?  ?Past Surgical History:  ?Procedure Laterality Date  ? ABDOMINAL HYSTERECTOMY  03/07/2000  ? Myoma with abnormal uterine bleeding; benign pathology; OVARIES INTACT  ? CARPAL TUNNEL RELEASE    ? Bilateral  ? COLONOSCOPY  03/07/2004  ? colon polyps single.  [place].  ? TONSILLECTOMY AND ADENOIDECTOMY    ? ?Social History  ? ?Occupational History  ? Occupation: UNEMPLOYED  ?  Employer: UNEMPLOYED  ?  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 Smoking status: Never  ? Smokeless tobacco: Never  ?Substance and Sexual Activity  ? Alcohol use: Yes  ?  Alcohol/week: 0.0 standard drinks  ?  Comment: Occassional use: <3 drinks/month  ?  Drug use: No  ? Sexual activity: Yes  ?  Partners: Male  ? ? ? ? ? ? ?

## 2021-06-02 NOTE — Telephone Encounter (Signed)
Contacted patient and addressed all questions and concerns that she had.  ?

## 2021-06-04 ENCOUNTER — Telehealth: Payer: Self-pay | Admitting: Orthopaedic Surgery

## 2021-06-04 ENCOUNTER — Telehealth: Payer: Self-pay | Admitting: Physical Medicine and Rehabilitation

## 2021-06-04 LAB — CBC
HCT: 37.3 % (ref 35.0–45.0)
Hemoglobin: 12.4 g/dL (ref 11.7–15.5)
MCH: 30.9 pg (ref 27.0–33.0)
MCHC: 33.2 g/dL (ref 32.0–36.0)
MCV: 93 fL (ref 80.0–100.0)
MPV: 10.4 fL (ref 7.5–12.5)
Platelets: 266 10*3/uL (ref 140–400)
RBC: 4.01 10*6/uL (ref 3.80–5.10)
RDW: 12.9 % (ref 11.0–15.0)
WBC: 8.9 10*3/uL (ref 3.8–10.8)

## 2021-06-04 LAB — HLA-B27 ANTIGEN: HLA-B27 Antigen: NEGATIVE

## 2021-06-04 LAB — ANA: Anti Nuclear Antibody (ANA): NEGATIVE

## 2021-06-04 LAB — RHEUMATOID FACTOR: Rheumatoid fact SerPl-aCnc: <14 IU/mL (ref <14)

## 2021-06-04 LAB — URIC ACID: Uric Acid, Serum: 3.5 mg/dL (ref 2.5–7.0)

## 2021-06-04 LAB — SEDIMENTATION RATE: Sed Rate: 6 mm/h (ref 0–30)

## 2021-06-04 NOTE — Telephone Encounter (Signed)
Pt is calling to state that she is still bothered with her knee. Pain in back of knee ?However she is on a fixed income and cant afford to come in just for results, so if she needs a MRI, please call and advise ? ?Her Next appt is 4-18 @ 9:15, she wants to cxl, as she is on a fixed income. I advised her to leave the appt , and someone will call her  ?

## 2021-06-04 NOTE — Telephone Encounter (Signed)
Patient called.  She would like to know if  it is ok to take her Skyri[facility] Monday? Her call back number is [phone] ?

## 2021-06-07 ENCOUNTER — Encounter: Payer: Self-pay | Admitting: Physical Medicine and Rehabilitation

## 2021-06-07 ENCOUNTER — Ambulatory Visit (INDEPENDENT_AMBULATORY_CARE_PROVIDER_SITE_OTHER): Payer: Medicare Other | Admitting: Physical Medicine and Rehabilitation

## 2021-06-07 ENCOUNTER — Ambulatory Visit: Payer: Self-pay

## 2021-06-07 ENCOUNTER — Other Ambulatory Visit: Payer: Self-pay

## 2021-06-07 VITALS — BP 124/83 | HR 72

## 2021-06-07 DIAGNOSIS — M5416 Radiculopathy, lumbar region: Secondary | ICD-10-CM

## 2021-06-07 DIAGNOSIS — M25561 Pain in right knee: Secondary | ICD-10-CM

## 2021-06-07 MED ORDER — METHYLPREDNISOLONE ACETATE 80 MG/ML IJ SUSP
80.0000 mg | Freq: Once | INTRAMUSCULAR | Status: AC
  Administered 2021-06-07: 80 mg

## 2021-06-07 NOTE — Telephone Encounter (Signed)
Sure let's order MRI.  Thanks.

## 2021-06-07 NOTE — Progress Notes (Signed)
Pt state lower back pain that travels to both legs. Pt state walking, standing and laying down makes the pain worse. Pt state she take pain meds to help ease her pain. ? ?Numeric Pain Rating Scale and Functional Assessment ?Average Pain 6 ? ? ?In the last MONTH (on 0-10 scale) has pain interfered with the following? ? ?1. General activity like being  able to carry out your everyday physical activities such as walking, climbing stairs, carrying groceries, or moving a chair?  ?Rating(10) ? ? ?+Driver, -BT, -Dye Allergies. ? ?

## 2021-06-07 NOTE — Patient Instructions (Signed)

## 2021-06-07 NOTE — Telephone Encounter (Signed)
MRI order made.   

## 2021-06-09 NOTE — Telephone Encounter (Signed)
Patient aware. MRI ordered. ?Cx appt for 06/22/2021 ?

## 2021-06-17 ENCOUNTER — Telehealth: Payer: Self-pay | Admitting: Orthopaedic Surgery

## 2021-06-17 NOTE — Telephone Encounter (Signed)
Called pt 1X and left vm for pt to call and set MRI review appt with Dr Roda Shutters after 4/21 ?

## 2021-06-22 ENCOUNTER — Ambulatory Visit: Payer: Medicare Other | Admitting: Orthopaedic Surgery

## 2021-06-25 ENCOUNTER — Ambulatory Visit
Admission: RE | Admit: 2021-06-25 | Discharge: 2021-06-25 | Disposition: A | Discharge status: Home / Self Care | Payer: Medicare Other | Source: Ambulatory Visit | Attending: Orthopaedic Surgery | Admitting: Orthopaedic Surgery

## 2021-06-25 DIAGNOSIS — M25561 Pain in right knee: Secondary | ICD-10-CM

## 2021-06-29 NOTE — Procedures (Signed)
Lumbosacral Transforaminal Epidural Steroid Injection - Sub-Pedicular Approach with Fluoroscopic Guidance ? ?Patient: Tammy [name]      ?Date of Birth: [birth year] ?MRN: [id number] ?PCP: Virgilio Belling, PA-C      ?Visit Date: 06/07/2021 ?  ?Universal Protocol:    ?Date/Time: 06/07/2021 ? ?Consent Given By: the patient ? ?Position: PRONE ? ?Additional Comments: ?Vital signs were monitored before and after the procedure. ?Patient was prepped and draped in the usual sterile fashion. ?The correct patient, procedure, and site was verified. ? ? ?Injection Procedure Details:  ? ?Procedure diagnoses: Lumbar radiculopathy [M54.16]   ? ?Meds Administered:  ?Meds ordered this encounter  ?Medications  ? methylPREDNISolone acetate (DEPO-MEDROL) injection 80 mg  ? ? ?Laterality: Bilateral ? ?Location/Site: L5 ? ?Needle:5.0 in., 22 ga.  Short bevel or Quincke spinal needle ? ?Needle Placement: Transforaminal ? ?Findings: ?  ? -Comments: Excellent flow of contrast along the nerve, nerve root and into the epidural space. ? ?Procedure Details: ?After squaring off the end-plates to get a true AP view, the C-arm was positioned so that an oblique view of the foramen as noted above was visualized. The target area is just inferior to the "nose of the scotty dog" or sub pedicular. The soft tissues overlying this structure were infiltrated with 2-3 ml. of 1% Lidocaine without Epinephrine. ? ?The spinal needle was inserted toward the target using a "trajectory" view along the fluoroscope beam.  Under AP and lateral visualization, the needle was advanced so it did not puncture dura and was located close the 6 O'Clock position of the pedical in AP tracterory. Biplanar projections were used to confirm position. Aspiration was confirmed to be negative for CSF and/or blood. A 1-2 ml. volume of Isovue-250 was injected and flow of contrast was noted at each level. Radiographs were obtained for documentation purposes.  ? ?After attaining the  desired flow of contrast documented above, a 0.5 to 1.0 ml test dose of 0.25% Marcaine was injected into each respective transforaminal space.  The patient was observed for 90 seconds post injection.  After no sensory deficits were reported, and normal lower extremity motor function was noted,   the above injectate was administered so that equal amounts of the injectate were placed at each foramen (level) into the transforaminal epidural space. ? ? ?Additional Comments:  ?No complications occurred ?Dressing: 2 x 2 sterile gauze and Band-Aid ?  ? ?Post-procedure details: ?Patient was observed during the procedure. ?Post-procedure instructions were reviewed. ? ?Patient left the clinic in stable condition. ? ?

## 2021-06-29 NOTE — Progress Notes (Signed)
? ?Tammy [name] - 66 y.o. female MRN [id number]  Date of birth: 12/26/1955 ? ?Office Visit Note: ?Visit Date: 06/07/2021 ?PCP: Andria Frames, PA-C ?Referred by: Andria Frames, PA-C ? ?Subjective: ?Chief Complaint  ?Patient presents with  ? Lower Back - Pain  ? Left Leg - Pain  ? Right Leg - Pain  ? ?HPI:  Tammy [name] is a 66 y.o. female who comes in today at the request of Dr. Eduard Roux for planned Bilateral L5-S1 Lumbar Transforaminal epidural steroid injection with fluoroscopic guidance.  The patient has failed conservative care including home exercise, medications, time and activity modification.  This injection will be diagnostic and hopefully therapeutic.  Please see requesting physician notes for further details and justification. MRI reviewed with images and spine model.  MRI reviewed in the note below. ? ?ROS Otherwise per HPI. ? ?Assessment & Plan: ?Visit Diagnoses:  ?  ICD-10-CM   ?1. Lumbar radiculopathy  M54.16 XR C-ARM NO REPORT  ?  Epidural Steroid injection  ?  methylPREDNISolone acetate (DEPO-MEDROL) injection 80 mg  ?  ?  ?Plan: No additional findings.  ? ?Meds & Orders:  ?Meds ordered this encounter  ?Medications  ? methylPREDNISolone acetate (DEPO-MEDROL) injection 80 mg  ?  ?Orders Placed This Encounter  ?Procedures  ? XR C-ARM NO REPORT  ? Epidural Steroid injection  ?  ?Follow-up: Return for visit to requesting provider as needed.  ? ?Procedures: ?No procedures performed  ?Lumbosacral Transforaminal Epidural Steroid Injection - Sub-Pedicular Approach with Fluoroscopic Guidance ? ?Patient: Tammy [name]      ?Date of Birth: 1955/06/17 ?MRN: [id number] ?PCP: Andria Frames, PA-C      ?Visit Date: 06/07/2021 ?  ?Universal Protocol:    ?Date/Time: 06/07/2021 ? ?Consent Given By: the patient ? ?Position: PRONE ? ?Additional Comments: ?Vital signs were monitored before and after the procedure. ?Patient was prepped and draped in the usual sterile fashion. ?The correct  patient, procedure, and site was verified. ? ? ?Injection Procedure Details:  ? ?Procedure diagnoses: Lumbar radiculopathy [M54.16]   ? ?Meds Administered:  ?Meds ordered this encounter  ?Medications  ? methylPREDNISolone acetate (DEPO-MEDROL) injection 80 mg  ? ? ?Laterality: Bilateral ? ?Location/Site: L5 ? ?Needle:5.0 in., 22 ga.  Short bevel or Quincke spinal needle ? ?Needle Placement: Transforaminal ? ?Findings: ?  ? -Comments: Excellent flow of contrast along the nerve, nerve root and into the epidural space. ? ?Procedure Details: ?After squaring off the end-plates to get a true AP view, the C-arm was positioned so that an oblique view of the foramen as noted above was visualized. The target area is just inferior to the "nose of the scotty dog" or sub pedicular. The soft tissues overlying this structure were infiltrated with 2-3 ml. of 1% Lidocaine without Epinephrine. ? ?The spinal needle was inserted toward the target using a "trajectory" view along the fluoroscope beam.  Under AP and lateral visualization, the needle was advanced so it did not puncture dura and was located close the 6 O'Clock position of the pedical in AP tracterory. Biplanar projections were used to confirm position. Aspiration was confirmed to be negative for CSF and/or blood. A 1-2 ml. volume of Isovue-250 was injected and flow of contrast was noted at each level. Radiographs were obtained for documentation purposes.  ? ?After attaining the desired flow of contrast documented above, a 0.5 to 1.0 ml test dose of 0.25% Marcaine was injected into each respective transforaminal space.  The patient was observed  for 90 seconds post injection.  After no sensory deficits were reported, and normal lower extremity motor function was noted,   the above injectate was administered so that equal amounts of the injectate were placed at each foramen (level) into the transforaminal epidural space. ? ? ?Additional Comments:  ?No complications  occurred ?Dressing: 2 x 2 sterile gauze and Band-Aid ?  ? ?Post-procedure details: ?Patient was observed during the procedure. ?Post-procedure instructions were reviewed. ? ?Patient left the clinic in stable condition. ?  ? ?Clinical History: ?MRI LUMBAR SPINE WITHOUT CONTRAST  ?   ?TECHNIQUE:  ?Multiplanar, multisequence MR imaging of the lumbar spine was  ?performed. No intravenous contrast was administered.  ?   ?COMPARISON:  No prior MRI, correlation is made with 02/24/2021  ?lumbar spine radiographs and CT abdomen pelvis 10/15/2007  ?   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  ?   ?Alignment: Mild levocurvature. Grade 1 anterolisthesis L4 on L5.  ?Trace retrolisthesis L2 on L3.  ?   ?Vertebrae: No acute fracture or suspicious osseous lesion. Multiple  ?T1 and T2 hyperintense foci in the vertebral bodies, compatible with  ?benign hemangiomas.  ?   ?Conus medullaris and cauda equina: Conus extends to the L1-L2 level.  ?Conus and cauda equina appear normal.  ?   ?Paraspinal and other soft tissues: Multiple renal cysts.  ?   ?Disc levels:  ?   ?T12-L1: No significant disc bulge. No spinal canal stenosis or  ?neural foraminal narrowing.  ?   ?L1-L2: No significant disc bulge. Moderate right and mild left facet  ?arthropathy. No spinal canal stenosis or neural foraminal narrowing.  ?   ?L2-L3: Disc height loss with mild disc bulge. Mild facet  ?arthropathy. No spinal canal stenosis or neural foraminal narrowing.  ?   ?L3-L4: Mild disc bulge. Moderate right and mild left facet  ?arthropathy. No spinal canal stenosis or neural foraminal narrowing.  ?   ?L4-L5: Grade 1 anterolisthesis with disc unroofing and broad-based  ?disc bulge. Severe facet arthropathy. Narrowing of the bilateral  ?lateral recesses. No spinal canal stenosis or neural foraminal  ?narrowing.  ?   ?L5-S1: No significant disc bulge. Mild facet arthropathy. No spinal  ?canal  stenosis or neural foraminal narrowing.  ?   ?IMPRESSION:  ?1. L4-L5 grade 1 anterolisthesis and severe facet arthropathy, with  ?narrowing of the lateral recesses, which could affect the descending  ?L5 nerves.  ?2. No spinal canal stenosis or neural foraminal narrowing.  ?3. 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 Please correlate with  ?imaging if any intervention is planned. In addition, an assimilation  ?joint can be a cause of pain.  ?   ?   ?Electronically Signed  ?  By: Merilyn Baba M.D.  ?  On: 03/09/2021 16:38  ? ? ? ?Objective:  VS:  HT:    WT:   BMI:     BP:124/83  HR:72bpm  TEMP: ( )  RESP:  ?Physical Exam ?Vitals and nursing note reviewed.  ?Constitutional:   ?   General: She is not in acute distress. ?   Appearance: Normal appearance. She is not ill-appearing.  ?HENT:  ?   Head: Normocephalic and atraumatic.  ?   Right Ear: External ear normal.  ?   Left Ear: External ear normal.  ?Eyes:  ?   Extraocular Movements: Extraocular movements intact.  ?Cardiovascular:  ?  Rate and Rhythm: Normal rate.  ?   Pulses: Normal pulses.  ?Pulmonary:  ?   Effort: Pulmonary effort is normal. No respiratory distress.  ?Abdominal:  ?   General: There is no distension.  ?   Palpations: Abdomen is soft.  ?Musculoskeletal:     ?   General: Tenderness present.  ?   Cervical back: Neck supple.  ?   Right lower leg: No edema.  ?   Left lower leg: No edema.  ?   Comments: Patient has good distal strength with no pain over the greater trochanters.  No clonus or focal weakness.  ?Skin: ?   Findings: No erythema, lesion or rash.  ?Neurological:  ?   General: No focal deficit present.  ?   Mental Status: She is alert and oriented to person, place, and time.  ?   Sensory: No sensory deficit.  ?   Motor: No weakness or abnormal muscle tone.  ?   Coordination: Coordination normal.  ?Psychiatric:     ?   Mood and Affect: Mood normal.     ?   Behavior: Behavior  normal.  ?  ? ?Imaging: ?No results found. ?

## 2021-07-01 ENCOUNTER — Encounter: Payer: Self-pay | Admitting: Orthopaedic Surgery

## 2021-07-01 ENCOUNTER — Ambulatory Visit (INDEPENDENT_AMBULATORY_CARE_PROVIDER_SITE_OTHER): Payer: Medicare Other | Admitting: Orthopaedic Surgery

## 2021-07-01 ENCOUNTER — Telehealth: Payer: Self-pay

## 2021-07-01 DIAGNOSIS — M1711 Unilateral primary osteoarthritis, right knee: Secondary | ICD-10-CM | POA: Diagnosis not present

## 2021-07-01 NOTE — Telephone Encounter (Signed)
Please submit for right knee gel inj 

## 2021-07-01 NOTE — Progress Notes (Signed)
? ?Office Visit Note ?  ?Patient: Tammy [name]           ?Date of Birth: 24-Oct-1955           ?MRN: [id number] ?Visit Date: 07/01/2021 ?             ?Requested by: Andria Frames, PA-C ?[address] ?[place],  [place] [id number] ?PCP: Andria Frames, PA-C ? ? ?Assessment & Plan: ?Visit Diagnoses:  ?1. Unilateral primary osteoarthritis, right knee   ? ? ?Plan: Impression is right knee degenerative joint disease.  Patient has previously tried cortisone injection without significant relief.  We have discussed proceeding with viscosupplementation injection for which she is agreeable to.  Follow-up once approved. ? ?This patient is diagnosed with osteoarthritis of the knee(s).   ? ?Radiographs show evidence of joint space narrowing, osteophytes, subchondral sclerosis and/or subchondral cysts.  This patient has knee pain which interferes with functional and activities of daily living.   ? ?This patient has experienced inadequate response, adverse effects and/or intolerance with conservative treatments such as acetaminophen, NSAIDS, topical creams, physical therapy or regular exercise, knee bracing and/or weight loss.  ? ?This patient has experienced inadequate response or has a contraindication to intra articular steroid injections for at least 3 months.  ? ?This patient is not scheduled to have a total knee replacement within 6 months of starting treatment with viscosupplementation. ? ?Total face to face encounter time was greater than 25 minutes and over half of this time was spent in counseling and/or coordination of care. ? ?Follow-Up Instructions: Return if symptoms worsen or fail to improve.  ? ?Orders:  ?No orders of the defined types were placed in this encounter. ? ?No orders of the defined types were placed in this encounter. ? ? ? ? Procedures: ?No procedures performed ? ? ?Clinical Data: ?No additional findings. ? ? ?Subjective: ?Chief Complaint  ?Patient presents with  ? Right Knee - Pain   ? ? ?HPI patient is a pleasant 66 year old female who comes in today to discuss right knee MRI results.  She has been complaining of right knee pain with an acute onset that began about a month ago.  She was seen at [facility] urgent care and then again in our office by [name] where cortisone injection was performed.  Arthritis panel was also drawn which was unremarkable.  She notes mild relief in symptoms following the injection.  She continues to endorse pain throughout the entire knee.  Pain is worse when she is sitting in a flexed knee position as well as when she goes from a seated to standing position or when she is standing for a long period of time.  She has previously been on Aleve and Celebrex with out relief of symptoms.  She is no longer taking NSAIDs as she has a history of ulcer.  She has been taking Tylenol without relief.  Recent MRI of the right knee shows advanced degenerative changes medial and patellofemoral compartments ?Review of Systems as detailed in HPI.  All others reviewed and are negative. ? ? ?Objective: ?Vital Signs: There were no vitals taken for this visit. ? ?Physical Exam well-developed well-nourished female no acute distress.  Alert and oriented x3. ? ?Ortho Exam right knee exam shows no effusion.  Range of motion 0 to 120 degrees.  Medial joint line tenderness.  Mild patellofemoral crepitus.  She is neurovascular intact distally. ? ?Specialty Comments:  ?MRI LUMBAR SPINE WITHOUT CONTRAST  ?   ?  TECHNIQUE:  ?Multiplanar, multisequence MR imaging of the lumbar spine was  ?performed. No intravenous contrast was administered.  ?   ?COMPARISON:  No prior MRI, correlation is made with 02/24/2021  ?lumbar spine radiographs and CT abdomen pelvis 10/15/2007  ?   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  ?   ?Alignment: Mild levocurvature. Grade 1 anterolisthesis L4 on L5.  ?Trace retrolisthesis L2 on L3.  ?    ?Vertebrae: No acute fracture or suspicious osseous lesion. Multiple  ?T1 and T2 hyperintense foci in the vertebral bodies, compatible with  ?benign hemangiomas.  ?   ?Conus medullaris and cauda equina: Conus extends to the L1-L2 level.  ?Conus and cauda equina appear normal.  ?   ?Paraspinal and other soft tissues: Multiple renal cysts.  ?   ?Disc levels:  ?   ?T12-L1: No significant disc bulge. No spinal canal stenosis or  ?neural foraminal narrowing.  ?   ?L1-L2: No significant disc bulge. Moderate right and mild left facet  ?arthropathy. No spinal canal stenosis or neural foraminal narrowing.  ?   ?L2-L3: Disc height loss with mild disc bulge. Mild facet  ?arthropathy. No spinal canal stenosis or neural foraminal narrowing.  ?   ?L3-L4: Mild disc bulge. Moderate right and mild left facet  ?arthropathy. No spinal canal stenosis or neural foraminal narrowing.  ?   ?L4-L5: Grade 1 anterolisthesis with disc unroofing and broad-based  ?disc bulge. Severe facet arthropathy. Narrowing of the bilateral  ?lateral recesses. No spinal canal stenosis or neural foraminal  ?narrowing.  ?   ?L5-S1: No significant disc bulge. Mild facet arthropathy. No spinal  ?canal stenosis or neural foraminal narrowing.  ?   ?IMPRESSION:  ?1. L4-L5 grade 1 anterolisthesis and severe facet arthropathy, with  ?narrowing of the lateral recesses, which could affect the descending  ?L5 nerves.  ?2. No spinal canal stenosis or neural foraminal narrowing.  ?3. 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 Please correlate with  ?imaging if any intervention is planned. In addition, an assimilation  ?joint can be a cause of pain.  ?   ?   ?Electronically Signed  ?  By: Merilyn Baba M.D.  ?  On: 03/09/2021 16:38 ? ?Imaging: ?No results found. ? ? ?PMFS History: ?Patient Active Problem List  ? Diagnosis Date Noted  ? Primary osteoarthritis of both hands 08/15/2016  ? Primary osteoarthritis of  both feet 08/15/2016  ? DDD (degenerative disc disease), lumbar 08/15/2016  ? Psoriasis 08/15/2016  ? Chest pain 06/02/2016  ? Prediabetes 06/01/2016  ? Transaminitis 04/25/2016  ? Low back pain 04/25/2016  ? History of anemia 03/17/2016  ? Health care maintenance 03/17/2016  ? Psoriatic arthritis ([place]) 07/20/2015  ? Interstitial cystitis 07/20/2015  ? Palpitations 08/13/2012  ? Insomnia 08/13/2012  ? Hyperlipidemia 08/13/2012  ? Essential hypertension, benign 02/28/2012  ? Cervical dysplasia 01/26/2012  ? Colon polyp 01/26/2012  ? Pulmonary nodule 11/27/2011  ? Obstructive sleep apnea-question of 11/27/2011  ? ESOPHAGEAL STRICTURE 12/05/2007  ? GERD 12/05/2007  ? ?Past Medical History:  ?Diagnosis Date  ? Abnormal Papanicolaou smear of cervix with positive human papilloma virus (HPV) test 04/2016  ? Allergic rhinitis, cause unspecified   ? Carpal tunnel syndrome   ? Chronic interstitial cystitis   ? Colon polyps 03/07/2004  ? Diaphragmatic hernia without mention of obstruction or gangrene   ? Dysplasia of cervix,  unspecified   ? Esophageal reflux   ? Essential hypertension, benign   ? Gastric ulcer, unspecified as acute or chronic, without mention of hemorrhage, perforation, or obstruction   ? Hiatal hernia   ? Hypertension   ? Insomnia, unspecified   ? Irritable bowel syndrome   ? Lung nodule 11/27/2011  ? CXR 11/14/2011-nodule versus osteophyte projecting over thoracic spine on lateral view CT chest 11/24/2011- confirms osteophyte with 2 small nodules right lower lobe and left lower lobe. CT 06/01/12- stable for 5 years.  No further follow-up necessary.   ? Methicillin resistant Staphylococcus aureus in conditions classified elsewhere and of unspecified site   ? Mixed incontinence urge and stress (female)(female)   ? Personal history of colonic polyps   ? Pure hypercholesterolemia   ?  ?Family History  ?Problem Relation Age of Onset  ? Hyperlipidemia Mother   ? Hypertension Mother   ? Hypertension Father   ? Coronary  artery disease Father 88  ?     stents  ? Hyperlipidemia Father   ?  ?Past Surgical History:  ?Procedure Laterality Date  ? ABDOMINAL HYSTERECTOMY  03/07/2000  ? Myoma with abnormal uterine bleeding; benign p

## 2021-07-09 NOTE — Telephone Encounter (Signed)
Noted  

## 2021-07-30 ENCOUNTER — Telehealth: Payer: Self-pay | Admitting: Orthopaedic Surgery

## 2021-07-30 NOTE — Telephone Encounter (Signed)
Patient called. She would like to know if the gel injection was approved or not. Her call back number is [phone]

## 2021-08-09 ENCOUNTER — Telehealth: Payer: Self-pay

## 2021-08-09 NOTE — Telephone Encounter (Signed)
VOB submitted for SynviscOne, right knee. VOB pending 

## 2021-08-10 ENCOUNTER — Other Ambulatory Visit: Payer: Self-pay

## 2021-08-10 DIAGNOSIS — M1711 Unilateral primary osteoarthritis, right knee: Secondary | ICD-10-CM

## 2021-08-10 NOTE — Telephone Encounter (Signed)
Talked with patient concerning gel injections and patient has declined to have injections due to being seen at another office.

## 2021-09-13 DIAGNOSIS — D485 Neoplasm of uncertain behavior of skin: Secondary | ICD-10-CM | POA: Insufficient documentation

## 2021-11-02 DIAGNOSIS — M1711 Unilateral primary osteoarthritis, right knee: Secondary | ICD-10-CM | POA: Insufficient documentation

## 2021-12-26 NOTE — Progress Notes (Unsigned)
12/28/21- 66 yoF never smoker for sleep evaluation with concern of snoring. Last seen by me for pulmonary nodule, cough 06/22/12. Medical problem list includes- Insomnia, HTN, Allergic Rhinitis, Lung Nodule, GERD, Hx Gastric Ulcer, Esophageal Stricture, Lumbar DDD, Osteoarthritis, Psoriasis, Interstitial Cystitis, Hyperlipidemia,   Following [facility] for DOE, Restrictive lung Disease, Pulmonary Nodule, OSA. DOE and restriction attributed to obesity.Updated PFT WNL. Stable benign nodules.Cough> reflux precautions, inhalers. Possible diastolic dysfunction.  Sleep study was recommended but not yet done. Recent R TKA O2 2L sleep- not worn in 5 years - Neb albuterol, albuterol hfa,  Epworth score-4 Body weight today-167 lbs Covid vax- Flu vax- today senior -----Pt currently snores, previously had an ono pt states o2 dropped while sleeping, was put on 2l at night  Patient is widowed and lives with grown daughter and granddaughter.  Daughter is here today.  Reports snoring, witnessed apnea.  Patient describes more restless sleep and waking after sleep onset since husband died of [place] in 06-23-2019.  She had COVID infection at the same time.Unclear if this is when she wore sleep O2 for 1-2 years, then dropped off. ENT surgery for tonsils and adenoids at age 66.  History of congestive heart failure treated by cardiology at [facility].  Uses rescue inhaler if she has a cold.  Told in the past that PFT was "fine".  Aware of history of stable lung nodules considered benign on CT. Bedtime medication clonazepam 0.5 mg x 1-3 tabs at bedtime and may also take Ambien 5 mg (1/2 x 10 mg tab) up to once or twice at night. Aware she tosses and turns and sleep with sleep recently less comfortable as she recovers from right TKA.  She denies any complex parasomnias including sleepwalking kicking or punching.  Alcohol 1 or 2 drinks per week.  Prior to Admission medications   Medication Sig Start Date End Date  Taking? Authorizing Provider  albuterol (PROVENTIL HFA;VENTOLIN HFA) 108 (90 BASE) MCG/ACT inhaler Inhale 2 puffs into the lungs every 6 (six) hours as needed.   Yes [provider]  albuterol (PROVENTIL) (2.5 MG/3ML) 0.083% nebulizer solution Take 2.5 mg by nebulization every 6 (six) hours as needed.   Yes [provider]  alendronate (FOSAMAX) 70 MG tablet Take 1 tablet by mouth once a week. 09/25/20  Yes [provider]  amphetamine-dextroamphetamine (ADDERALL XR) 20 MG 24 hr capsule  09/04/20  Yes [provider]  aspirin 81 MG tablet Take 81 mg by mouth daily.   Yes [provider]  b complex vitamins tablet Take 1 tablet by mouth daily.   Yes [provider]  baclofen (LIORESAL) 10 MG tablet Take 10 mg by mouth 3 (three) times daily. 11/05/21  Yes [provider]  Cholecalciferol 125 MCG (5000 UT) TABS Take by mouth.   Yes [provider]  clonazePAM (KLONOPIN) 0.5 MG tablet Take 1 mg by mouth 2 (two) times daily. 11/12/21  Yes [provider]  ferrous sulfate 325 (65 FE) MG tablet Take 325 mg by mouth daily with breakfast.   Yes [provider]  furosemide (LASIX) 20 MG tablet  09/05/19  Yes [provider]  hydrochlorothiazide (HYDRODIURIL) 25 MG tablet Take 1 tablet (25 mg total) by mouth daily. 06/02/16  Yes Maryellen Pile, MD  Hyoscyamine Sulfate SL (LEVSIN/SL) 0.125 MG SUBL Place 0.125 mg under the tongue every 4 (four) hours as needed. 03/28/16  Yes Levin Erp, PA  JARDIANCE 10 MG TABS tablet Take  10 mg by mouth daily. 10/25/21  Yes [provider]  metoprolol tartrate (LOPRESSOR) 25 MG tablet take ONE-HALF TABLET BY MOUTH AS NEEDED 06/29/16  Yes Valentino Nose, MD  Multiple Vitamin (MULTIVITAMIN) tablet Take 1 tablet by mouth daily.   Yes [provider]  ondansetron (ZOFRAN) 8 MG tablet Take 1 tablet (8 mg total) by mouth as directed. 03/28/16  Yes Unk Lightning, PA  oxyCODONE (OXY IR/ROXICODONE) 5 MG immediate release tablet Take 5 mg by mouth 4 (four) times daily as needed. 12/10/21  Yes [provider]  potassium chloride (KLOR-CON) 10 MEQ tablet Take 10 mEq by mouth 2 (two) times daily. 11/11/21  Yes [provider]  pregabalin (LYRICA) 50 MG capsule Take 50 mg by mouth 2 (two) times daily. 12/10/21  Yes [provider]  rosuvastatin (CRESTOR) 40 MG tablet Take 40 mg by mouth daily. 09/02/21  Yes [provider]  simethicone (MYLICON) 125 MG chewable tablet Chew 125 mg by mouth every 6 (six) hours as needed for flatulence.   Yes [provider]  sucralfate (CARAFATE) 1 g tablet Take by mouth. 12/04/20  Yes [provider]  zolpidem (AMBIEN) 10 MG tablet Take 1 tab at night as needed for sleep 06/01/17  Yes [provider]  HYDROcodone-acetaminophen (NORCO) 5-325 MG tablet Take 1 tablet by mouth daily as needed. 03/10/21   Tarry Kos, MD  losartan (COZAAR) 25 MG tablet Take 1 tablet (25 mg total) by mouth daily. 09/26/16 09/26/17  Valentino Nose, MD  pantoprazole (PROTONIX) 40 MG tablet Take 1 tablet (40 mg total) by mouth daily. 06/29/16 06/29/17  Valentino Nose, MD  triamcinolone cream (KENALOG) 0.1 % Apply to lesions 2-4 times a day as needed to lesions. Do not apply to your face. 03/17/16   Valentino Nose, MD   Past Medical History:  Diagnosis Date   Abnormal Papanicolaou smear of cervix with positive human papilloma virus (HPV) test 04/2016   Allergic rhinitis, cause unspecified    Carpal tunnel syndrome    Chronic interstitial cystitis    Colon polyps 03/07/2004   Diaphragmatic hernia without mention of obstruction or gangrene    Dysplasia of cervix, unspecified    Esophageal reflux    Essential hypertension, benign    Gastric ulcer, unspecified as acute or chronic, without mention of hemorrhage, perforation, or obstruction    Hiatal hernia    Hypertension    Insomnia, unspecified     Irritable bowel syndrome    Lung nodule 11/27/2011   CXR 11/14/2011-nodule versus osteophyte projecting over thoracic spine on lateral view CT chest 11/24/2011- confirms osteophyte with 2 small nodules right lower lobe and left lower lobe. CT 06/01/12- stable for 5 years.  No further follow-up necessary.    Methicillin resistant Staphylococcus aureus in conditions classified elsewhere and of unspecified site    Mixed incontinence urge and stress (female)(female)    Personal history of colonic polyps    Pure hypercholesterolemia    Past Surgical History:  Procedure Laterality Date   ABDOMINAL HYSTERECTOMY  03/07/2000   Myoma with abnormal uterine bleeding; benign pathology; OVARIES INTACT   CARPAL TUNNEL RELEASE     Bilateral   COLONOSCOPY  03/07/2004   colon polyps single.  [place].   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 Husband on long term disability.       Lives with: husband; Pt's daughter and grandchild also live in the home.        Children:  2 children, 1 stepchild  and 5 grandchildren.        Exercise:  None     Social Determinants of Health   Financial Resource Strain: Not on file  Food Insecurity: Not on file  Transportation Needs: Not on file  Physical Activity: Not on file  Stress: Not on file  Social Connections: Not on file  Intimate Partner Violence: Not on file   ROS-see HPI   + = positive Constitutional:    weight loss, night sweats, fevers, chills, fatigue, lassitude. HEENT:    headaches, difficulty swallowing, tooth/dental problems, sore throat,       sneezing, itching, ear ache, nasal congestion, post nasal drip, snoring CV:   + chest pain, orthopnea, PND, swelling in lower extremities, anasarca,                              dizziness, +palpitations Resp:   +shortness of breath with exertion or at rest.                productive cough,   non-productive cough, coughing up of blood.              change in color of mucus.  wheezing.   Skin:    rash or lesions. GI:  + heartburn, +indigestion, abdominal pain, nausea, vomiting, diarrhea,                 change in bowel habits, loss of appetite GU: dysuria, change in color of urine, no urgency or frequency.   flank pain. MS:   joint pain, stiffness, decreased range of motion, back pain. Neuro-     nothing unusual Psych:  change in mood or affect.  depression or anxiety.   memory loss.  OBJ- Physical Exam General- Alert, Oriented, Affect-appropriate, Distress- none acute Skin- rash-none, lesions- none, excoriation- none Lymphadenopathy- none Head- atraumatic            Eyes- Gross vision intact, PERRLA, conjunctivae and secretions clear            Ears- Hearing, canals-normal            Nose- Clear, no-Septal dev, mucus, polyps, erosion, perforation             Throat- Mallampati III-IV, mucosa clear , drainage- none, tonsils absent, +partial plate Neck- flexible , trachea midline, no stridor , thyroid  nl, carotid no bruit Chest - symmetrical excursion , unlabored           Heart/CV-  RRR , no murmur , no gallop  , no rub, nl s1 s2                           - JVD- none , edema- none, stasis changes- none, varices- none           Lung- clear to P&A, wheeze- none, cough- none , dullness-none, rub- none           Chest wall-  Abd-  Br/ Gen/ Rectal- Not done, not indicated Extrem- +protecting R knee/ recent TKA Neuro- grossly intact to observation

## 2021-12-28 ENCOUNTER — Ambulatory Visit (INDEPENDENT_AMBULATORY_CARE_PROVIDER_SITE_OTHER): Payer: Medicare Other | Admitting: Internal Medicine

## 2021-12-28 ENCOUNTER — Encounter: Payer: Self-pay | Admitting: Internal Medicine

## 2021-12-28 VITALS — BP 114/78 | HR 58 | Ht 59.0 in | Wt 167.2 lb

## 2021-12-28 DIAGNOSIS — Z23 Encounter for immunization: Secondary | ICD-10-CM | POA: Diagnosis not present

## 2021-12-28 DIAGNOSIS — G4733 Obstructive sleep apnea (adult) (pediatric): Secondary | ICD-10-CM | POA: Diagnosis not present

## 2021-12-28 DIAGNOSIS — F5101 Primary insomnia: Secondary | ICD-10-CM | POA: Diagnosis not present

## 2021-12-28 NOTE — Patient Instructions (Signed)
Order- schedule home sleep test   dx OSA  Please cal [place] about 2 weeks after your sleep test for results and recommendations

## 2021-12-29 NOTE — Assessment & Plan Note (Signed)
She pretty clearly relates difficulty initiating and maintaining sleep to some degree of anxiety and depression since husband died of [place] in 06-30-19.  Now using clonazepam and Ambien. Plan-reassess if appropriate after sleep study.

## 2021-12-29 NOTE — Assessment & Plan Note (Signed)
Appropriate now to reevaluate.  Basics of obstructive sleep apnea and sleep hygiene reviewed. Plan-schedule sleep study.  Anticipate CPAP would be the appropriate choice if needed.

## 2022-01-20 DIAGNOSIS — E6609 Other obesity due to excess calories: Secondary | ICD-10-CM | POA: Insufficient documentation

## 2022-03-15 ENCOUNTER — Telehealth: Payer: Self-pay | Admitting: Internal Medicine

## 2022-03-15 DIAGNOSIS — G4733 Obstructive sleep apnea (adult) (pediatric): Secondary | ICD-10-CM

## 2022-03-15 NOTE — Telephone Encounter (Signed)
PT calling wondering where she is in line for a HSS. Also, her son was in to see Dr. Annamaria Boots. She wondered about him too. Adv we will call to advise.TY.  Son: Larena Glassman  DOB: 01/15/94

## 2022-03-15 NOTE — Telephone Encounter (Signed)
No Hst order on this patient

## 2022-03-16 ENCOUNTER — Ambulatory Visit: Payer: Medicare Other

## 2022-03-16 DIAGNOSIS — R0683 Snoring: Secondary | ICD-10-CM

## 2022-03-16 DIAGNOSIS — G4733 Obstructive sleep apnea (adult) (pediatric): Secondary | ICD-10-CM

## 2022-03-16 NOTE — Telephone Encounter (Signed)
Pt seen Dr. Annamaria Boots Oct 2023 order was not placed for HST, order sent  Skp to pt let her know HST ordered

## 2022-03-18 DIAGNOSIS — R0683 Snoring: Secondary | ICD-10-CM | POA: Diagnosis not present

## 2022-06-15 ENCOUNTER — Encounter: Payer: Self-pay | Admitting: Nurse Practitioner

## 2022-06-15 ENCOUNTER — Telehealth: Payer: Medicare Other | Admitting: Nurse Practitioner

## 2022-06-15 DIAGNOSIS — G4719 Other hypersomnia: Secondary | ICD-10-CM

## 2022-06-15 DIAGNOSIS — R0683 Snoring: Secondary | ICD-10-CM

## 2022-06-15 DIAGNOSIS — G4734 Idiopathic sleep related nonobstructive alveolar hypoventilation: Secondary | ICD-10-CM

## 2022-06-15 DIAGNOSIS — I5032 Chronic diastolic (congestive) heart failure: Secondary | ICD-10-CM

## 2022-06-15 NOTE — Assessment & Plan Note (Signed)
Euvolemic per her report. Followed by [facility] cardiology.

## 2022-06-15 NOTE — Progress Notes (Signed)
Patient ID: Tammy [name], female     DOB: 10/02/1955, 67 y.o.      MRN: [id number]  No chief complaint on file.   Virtual Visit via Video Note  I connected with Tammy [name] on 06/15/22 at 10:30 AM EDT by a video enabled telemedicine application and verified that I am speaking with the correct person using two identifiers.  Location: Patient: Home Provider: Office   I discussed the limitations of evaluation and management by telemedicine and the availability of in person appointments. The patient expressed understanding and agreed to proceed.  History of Present Illness: 67 year old female, never smoker followed for nocturnal hypoxia and insomnia.  She is a patient of Dr. Roxy Cedar and last seen for sleep evaluation on 12/28/2021.  She is also followed by [facility] pulmonology for post-COVID DOE, pulmonary nodule and restrictive lung disease felt to be related to obesity.  Past medical history significant for hypertension, GERD, psoriatic arthritis, HLD, prediabetes.  TESTS/EVENTS: 03/17/2022 HST: AHI 2.4/h, SpO2 low 71%  12/28/2021: OV with Dr. Maple Hudson.  Last seen in 13-Jul-2012 for pulmonary nodule and cough.  Stable benign nodules on follow-up imaging.  She is also followed by pulmonary at [facility] for DOE, restrictive lung disease and concern for possible OSA.  She reports snoring and previous ONO with oxygen desaturations while sleeping.  She was started on 2 L supplemental O2 at night.  She reports more restless sleep and waking after sleep onset since has been died of COVID in 07/14/19.  She also had COVID at the same time.  Unclear if this is when she wore the supplemental oxygen but is no longer using it.  She is currently on clonazepam 0.5 mg x 1 to 3 tablets and Ambien 5 mg up to once or twice a night as needed for sleep.  She will need sleep study for further evaluation.  06/15/2022: Today-follow-up Patient presents today for overdue  follow-up via video visit.  She had a sleep study in January 2024 which did not show any significant sleep apnea.  She still had oxygen desaturations with SpO2 low of 71%. She does have a long history of snoring. She has trouble with her sleep at night, which primarily started after her husband passes away in 07/14/19. She did have trouble staying asleep prior to this. She feels tired during the day, which has gone on for many years. She denies morning headaches, drowsy driving sleep parasomnias/paralysis. She is not currently wearing her oxygen at night. She was diagnosed with CHF last year. No weight gain, orthopnea, or leg swelling. She had normal PFTs with [facility] last year.   Allergies  Allergen Reactions   Trazodone Shortness Of Breath    Fast heart rate   Lisinopril Cough   Septra Ds [Sulfamethoxazole-Trimethoprim] Nausea Only   Immunization History  Administered Date(s) Administered   Fluad Quad(high Dose 65+) 12/28/2021   Hepatitis B, PED/ADOLESCENT 06/01/2016, 06/01/2016   Influenza Split 11/29/2008, 11/14/2011   Influenza,inj,Quad PF,6+ Mos 11/28/2012, 12/18/2013, 11/26/2014, 03/17/2016   Moderna Sars-Covid-2 Vaccination 06/10/2019   PFIZER Comirnaty(Gray Top)Covid-19 Tri-Sucrose Vaccine 02/09/2020, 06/19/2020   Pneumococcal Polysaccharide-23 11/14/2011   Tdap 08/13/2012   Past Medical History:  Diagnosis Date   Abnormal Papanicolaou smear of cervix with positive human papilloma virus (HPV) test 04/2016   Allergic rhinitis, cause unspecified    Carpal tunnel syndrome    Chronic interstitial cystitis    Colon polyps 03/07/2004   Diaphragmatic hernia  without mention of obstruction or gangrene    Dysplasia of cervix, unspecified    Esophageal reflux    Essential hypertension, benign    Gastric ulcer, unspecified as acute or chronic, without mention of hemorrhage, perforation, or obstruction    Hiatal hernia    Hypertension    Insomnia, unspecified    Irritable bowel  syndrome    Lung nodule 11/27/2011   CXR 11/14/2011-nodule versus osteophyte projecting over thoracic spine on lateral view CT chest 11/24/2011- confirms osteophyte with 2 small nodules right lower lobe and left lower lobe. CT 06/01/12- stable for 5 years.  No further follow-up necessary.    Methicillin resistant Staphylococcus aureus in conditions classified elsewhere and of unspecified site    Mixed incontinence urge and stress (female)(female)    Personal history of colonic polyps    Pure hypercholesterolemia     Tobacco History: Social History   Tobacco Use  Smoking Status Never  Smokeless Tobacco Never   Counseling given: Not Answered   Outpatient Medications Prior to Visit  Medication Sig Dispense Refill   albuterol (PROVENTIL HFA;VENTOLIN HFA) 108 (90 BASE) MCG/ACT inhaler Inhale 2 puffs into the lungs every 6 (six) hours as needed.     albuterol (PROVENTIL) (2.5 MG/3ML) 0.083% nebulizer solution Take 2.5 mg by nebulization every 6 (six) hours as needed.     alendronate (FOSAMAX) 70 MG tablet Take 1 tablet by mouth once a week.     amphetamine-dextroamphetamine (ADDERALL XR) 20 MG 24 hr capsule      aspirin 81 MG tablet Take 81 mg by mouth daily.     b complex vitamins tablet Take 1 tablet by mouth daily.     baclofen (LIORESAL) 10 MG tablet Take 10 mg by mouth 3 (three) times daily.     Cholecalciferol 125 MCG (5000 UT) TABS Take by mouth.     clonazePAM (KLONOPIN) 0.5 MG tablet Take 1 mg by mouth 2 (two) times daily.     ferrous sulfate 325 (65 FE) MG tablet Take 325 mg by mouth daily with breakfast.     furosemide (LASIX) 20 MG tablet      hydrochlorothiazide (HYDRODIURIL) 25 MG tablet Take 1 tablet (25 mg total) by mouth daily. 90 tablet 2   Hyoscyamine Sulfate SL (LEVSIN/SL) 0.125 MG SUBL Place 0.125 mg under the tongue every 4 (four) hours as needed. 30 each 3   JARDIANCE 10 MG TABS tablet Take 10 mg by mouth daily.     metoprolol tartrate (LOPRESSOR) 25 MG tablet take  ONE-HALF TABLET BY MOUTH AS NEEDED 30 tablet 2   Multiple Vitamin (MULTIVITAMIN) tablet Take 1 tablet by mouth daily.     ondansetron (ZOFRAN) 8 MG tablet Take 1 tablet (8 mg total) by mouth as directed. 10 tablet 0   oxyCODONE (OXY IR/ROXICODONE) 5 MG immediate release tablet Take 5 mg by mouth 4 (four) times daily as needed.     potassium chloride (KLOR-CON) 10 MEQ tablet Take 10 mEq by mouth 2 (two) times daily.     pregabalin (LYRICA) 50 MG capsule Take 50 mg by mouth 2 (two) times daily.     rosuvastatin (CRESTOR) 40 MG tablet Take 40 mg by mouth daily.     simethicone (MYLICON) 125 MG chewable tablet Chew 125 mg by mouth every 6 (six) hours as needed for flatulence.     sucralfate (CARAFATE) 1 g tablet Take by mouth.     zolpidem (AMBIEN) 10 MG tablet Take 1 tab at night as  needed for sleep     losartan (COZAAR) 25 MG tablet Take 1 tablet (25 mg total) by mouth daily. 30 tablet 0   pantoprazole (PROTONIX) 40 MG tablet Take 1 tablet (40 mg total) by mouth daily. 90 tablet 2   Facility-Administered Medications Prior to Visit  Medication Dose Route Frequency Provider Last Rate Last Admin   0.9 %  sodium chloride infusion  500 mL Intravenous Continuous Hilarie Fredrickson, MD         Review of Systems:   Constitutional: No weight loss or gain, night sweats, fevers, chills, or lassitude. +fatigue HEENT: No headaches, difficulty swallowing, tooth/dental problems, or sore throat. No sneezing, itching, ear ache, nasal congestion, or post nasal drip CV:  No chest pain, orthopnea, PND, swelling in lower extremities, anasarca, dizziness, palpitations, syncope Resp: +snoring, shortness of breath with exertion (baseline). No excess mucus or change in color of mucus. No productive or non-productive. No hemoptysis. No wheezing.  No chest wall deformity GI:  No heartburn, indigestion, abdominal pain, nausea, vomiting, diarrhea, change in bowel habits, loss of appetite, bloody stools.  GU: No dysuria, change  in color of urine, urgency or frequency.  No flank pain, no hematuria  Skin: No rash, lesions, ulcerations MSK:  No joint pain or swelling.   Neuro: No dizziness or lightheadedness.  Psych: No depression or anxiety. Mood stable. +sleep disturbance  Observations/Objective: Patient is well-developed, well-nourished in no acute distress. A&O x3. Resting comfortably at home. Unlabored, regular breathing. Speech is clear and coherent with logical content.    Assessment and Plan: Nocturnal hypoxia She has a history of nocturnal hypoxia, previously on supplemental O2. She returned this a few years ago. She has longstanding history of snoring and daytime fatigue. She had a negative home sleep study with AHI 2.4/h and moderate/severe oxygen desaturations. Unclear etiology. She does have diastolic HF but seems to be compensated on current regimen. Previous PFTs were nl per the note from [facility] pul[facility]. Recommended that we complete in lab study for further evaluation given her unexplained nocturnal desaturations and significant daytime symptoms to ensure that she truly does not have OSA and determine appropriate treatment. Cautioned on safe driving practices.  Patient Instructions  Your home sleep study did not show any significant sleep apnea but you still were having low oxygen levels. Given this, your history and persistent symptoms, I am recommending we complete an in lab study for further evaluation. Someone will contact you to schedule this.  We discussed how untreated sleep apnea puts an individual at risk for cardiac arrhthymias, pulm HTN, DM, stroke and increases their risk for daytime accidents. We also briefly reviewed treatment options including weight loss, side sleeping position, oral appliance, CPAP therapy or referral to ENT for possible surgical options  Use caution when driving and pull over if you become sleepy.  Follow up in 6 weeks with Dr. Maple Hudson or Florentina Addison Malori Myers,NP to go over sleep study  results, or sooner, if needed     Loud snoring See above  Excessive daytime sleepiness See above  Diastolic CHF, chronic Euvolemic per her report. Followed by [facility] cardiology.      I discussed the assessment and treatment plan with the patient. The patient was provided an opportunity to ask questions and all were answered. The patient agreed with the plan and demonstrated an understanding of the instructions.   The patient was advised to call back or seek an in-person evaluation if the symptoms worsen or if the condition  fails to improve as anticipated.  I provided 32 minutes of non-face-to-face time during this encounter.   Noemi ChapelKatherine V Da Michelle, NP

## 2022-06-15 NOTE — Assessment & Plan Note (Signed)
See above

## 2022-06-15 NOTE — Assessment & Plan Note (Signed)
She has a history of nocturnal hypoxia, previously on supplemental O2. She returned this a few years ago. She has longstanding history of snoring and daytime fatigue. She had a negative home sleep study with AHI 2.4/h and moderate/severe oxygen desaturations. Unclear etiology. She does have diastolic HF but seems to be compensated on current regimen. Previous PFTs were nl per the note from [facility]. Recommended that we complete in lab study for further evaluation given her unexplained nocturnal desaturations and significant daytime symptoms to ensure that she truly does not have OSA and determine appropriate treatment. Cautioned on safe driving practices.  Patient Instructions  Your home sleep study did not show any significant sleep apnea but you still were having low oxygen levels. Given this, your history and persistent symptoms, I am recommending we complete an in lab study for further evaluation. Someone will contact you to schedule this.  We discussed how untreated sleep apnea puts an individual at risk for cardiac arrhthymias, pulm HTN, DM, stroke and increases their risk for daytime accidents. We also briefly reviewed treatment options including weight loss, side sleeping position, oral appliance, CPAP therapy or referral to ENT for possible surgical options  Use caution when driving and pull over if you become sleepy.  Follow up in 6 weeks with Tammy [name] or Tammy Addison Evalynne Locurto,NP to go over sleep study results, or sooner, if needed

## 2022-06-15 NOTE — Patient Instructions (Signed)
Your home sleep study did not show any significant sleep apnea but you still were having low oxygen levels. Given this, your history and persistent symptoms, I am recommending we complete an in lab study for further evaluation. Someone will contact you to schedule this.  We discussed how untreated sleep apnea puts an individual at risk for cardiac arrhthymias, pulm HTN, DM, stroke and increases their risk for daytime accidents. We also briefly reviewed treatment options including weight loss, side sleeping position, oral appliance, CPAP therapy or referral to ENT for possible surgical options  Use caution when driving and pull over if you become sleepy.  Follow up in 6 weeks with Dr. Maple Hudson or Florentina Addison Omnia Dollinger,NP to go over sleep study results, or sooner, if needed

## 2022-06-20 ENCOUNTER — Telehealth: Payer: Self-pay | Admitting: Nurse Practitioner

## 2022-06-21 NOTE — Telephone Encounter (Signed)
Sir   Can you advise if you are wanting patient to do an in lab sleep study?  Please advise

## 2022-06-21 NOTE — Telephone Encounter (Signed)
If she thinks she would sleep better at home, then ok to schedule a home sleep test for dx snoring.

## 2022-06-22 NOTE — Telephone Encounter (Signed)
Spoke with patient. Advised per [facility] she wanted patient to have in lab study due to low o2 levels. Patient is willing to have the study so nfn. She already is scheduled. Closing encounter

## 2022-07-10 ENCOUNTER — Encounter (HOSPITAL_BASED_OUTPATIENT_CLINIC_OR_DEPARTMENT_OTHER): Payer: Medicare Other | Admitting: Pulmonary Disease

## 2022-07-25 ENCOUNTER — Ambulatory Visit (HOSPITAL_BASED_OUTPATIENT_CLINIC_OR_DEPARTMENT_OTHER): Payer: Medicare Other | Attending: Nurse Practitioner | Admitting: Pulmonary Disease

## 2022-07-25 DIAGNOSIS — G4734 Idiopathic sleep related nonobstructive alveolar hypoventilation: Secondary | ICD-10-CM | POA: Diagnosis not present

## 2022-07-25 DIAGNOSIS — I5032 Chronic diastolic (congestive) heart failure: Secondary | ICD-10-CM | POA: Insufficient documentation

## 2022-07-25 DIAGNOSIS — G4733 Obstructive sleep apnea (adult) (pediatric): Secondary | ICD-10-CM | POA: Insufficient documentation

## 2022-07-25 DIAGNOSIS — R0683 Snoring: Secondary | ICD-10-CM | POA: Diagnosis not present

## 2022-07-26 DIAGNOSIS — G4734 Idiopathic sleep related nonobstructive alveolar hypoventilation: Secondary | ICD-10-CM

## 2022-07-26 NOTE — Procedures (Signed)
       Patient Name: Tammy [name], Tammy [name] Date: 07/25/2022 Gender: Female D.O.B: December 09, 1955 Age (years): 48 Referring Provider: Noemi Chapel NP Height (inches): 60 Interpreting Physician: Coralyn Helling MD, ABSM Weight (lbs): 155 RPSGT: Rosette Reveal BMI: 30 MRN: [id number] Neck Size: 15.00  CLINICAL INFORMATION Sleep Study Type: NPSG  Indication for sleep study: Hypertension  Epworth Sleepiness Score: 6  SLEEP STUDY TECHNIQUE As per the AASM Manual for the Scoring of Sleep and Associated Events v2.3 (April 2016) with a hypopnea requiring 4% desaturations.  The channels recorded and monitored were frontal, central and occipital EEG, electrooculogram (EOG), submentalis EMG (chin), nasal and oral airflow, thoracic and abdominal wall motion, anterior tibialis EMG, snore microphone, electrocardiogram, and pulse oximetry.  MEDICATIONS Medications self-administered by patient taken the night of the study : CLONAZEPAM, ZOLPIDEM TARTRATE  SLEEP ARCHITECTURE The study was initiated at 10:46:35 PM and ended at 4:54:31 AM.  Sleep onset time was 18.3 minutes and the sleep efficiency was 33.3%. The total sleep time was 122.5 minutes.  Stage REM latency was N/A minutes.  The patient spent 15.5% of the night in stage N1 sleep, 84.5% in stage N2 sleep, 0.0% in stage N3 and 0% in REM.  Alpha intrusion was absent.  Supine sleep was 62.86%.  RESPIRATORY PARAMETERS The overall apnea/hypopnea index (AHI) was 6.9 per hour. There were 0 total apneas, including 0 obstructive, 0 central and 0 mixed apneas. There were 14 hypopneas and 5 RERAs.  The AHI during Stage REM sleep was N/A per hour.  AHI while supine was 10.1 per hour.  The mean oxygen saturation was 91.8%. The minimum SpO2 during sleep was 88.0%.  moderate snoring was noted during this study.  CARDIAC DATA The 2 lead EKG demonstrated sinus rhythm. The mean heart rate was 48.9 beats per minute. Other EKG findings  include: None.  LEG MOVEMENT DATA The total PLMS were 0 with a resulting PLMS index of 0.0. Associated arousal with leg movement index was 0.0 .  IMPRESSIONS - Mild obstructive sleep apnea with an AHI of 6.9 and SpO2 low of 88%. - Supplemental oxygen not needed during this study. - She had trouble with sleep initiation and sleep maintenance. - The patient snored with moderate snoring volume.  DIAGNOSIS - Obstructive Sleep Apnea  RECOMMENDATIONS - Additional therapies include CPAP, oral appliance, or surgical assessment. - She should be assessed for insomnia. - Avoid alcohol, sedatives and other CNS depressants that may worsen sleep apnea and disrupt normal sleep architecture. - Sleep hygiene should be reviewed to assess factors that may improve sleep quality. - Weight management and regular exercise should be initiated or continued if appropriate.  [Electronically signed] 07/26/2022 12:44 PM  Coralyn Helling MD, ABSM Diplomate, American Board of Sleep Medicine NPI: [id number]  [facility] PH: [phone]   FX: [phone] ACCREDITED BY THE AMERICAN ACADEMY OF SLEEP MEDICINE

## 2022-08-08 ENCOUNTER — Telehealth: Payer: Self-pay | Admitting: Nurse Practitioner

## 2022-08-09 NOTE — Telephone Encounter (Signed)
Called and spoke with Ms.Neale Burly. We went over split night study results, but verbalized understanding and will contact the office when she knows her daughters schedule to make f/u visit. Nothing further needed at this time.

## 2022-10-31 ENCOUNTER — Ambulatory Visit (INDEPENDENT_AMBULATORY_CARE_PROVIDER_SITE_OTHER): Payer: Medicare Other

## 2022-10-31 ENCOUNTER — Ambulatory Visit: Payer: Medicare Other | Admitting: Podiatry

## 2022-10-31 DIAGNOSIS — M7752 Other enthesopathy of left foot: Secondary | ICD-10-CM | POA: Diagnosis not present

## 2022-10-31 DIAGNOSIS — M779 Enthesopathy, unspecified: Secondary | ICD-10-CM

## 2022-10-31 MED ORDER — METHYLPREDNISOLONE 4 MG PO TBPK: ORAL_TABLET | ORAL | 0 refills | Status: AC

## 2022-10-31 MED ORDER — MELOXICAM 15 MG PO TABS: 15.0000 mg | ORAL_TABLET | Freq: Every day | ORAL | 1 refills | Status: DC

## 2022-10-31 NOTE — Progress Notes (Unsigned)
No chief complaint on file.   HPI: 67 y.o. female presenting today for evaluation of pain and tenderness associated to the left second toe and forefoot.  Denies a history of injury.  Onset about a month ago.  She has not done anything for treatment.  She says it is very painful and tender.  Past Medical History:  Diagnosis Date   Abnormal Papanicolaou smear of cervix with positive human papilloma virus (HPV) test 04/2016   Allergic rhinitis, cause unspecified    Carpal tunnel syndrome    Chronic interstitial cystitis    Colon polyps 03/07/2004   Diaphragmatic hernia without mention of obstruction or gangrene    Dysplasia of cervix, unspecified    Esophageal reflux    Essential hypertension, benign    Gastric ulcer, unspecified as acute or chronic, without mention of hemorrhage, perforation, or obstruction    Hiatal hernia    Hypertension    Insomnia, unspecified    Irritable bowel syndrome    Lung nodule 11/27/2011   CXR 11/14/2011-nodule versus osteophyte projecting over thoracic spine on lateral view CT chest 11/24/2011- confirms osteophyte with 2 small nodules right lower lobe and left lower lobe. CT 06/01/12- stable for 5 years.  No further follow-up necessary.    Methicillin resistant Staphylococcus aureus in conditions classified elsewhere and of unspecified site    Mixed incontinence urge and stress (female)(female)    Personal history of colonic polyps    Pure hypercholesterolemia     Past Surgical History:  Procedure Laterality Date   ABDOMINAL HYSTERECTOMY  03/07/2000   Myoma with abnormal uterine bleeding; benign pathology; OVARIES INTACT   CARPAL TUNNEL RELEASE     Bilateral   COLONOSCOPY  03/07/2004   colon polyps single.  [place].   TONSILLECTOMY AND ADENOIDECTOMY      Allergies  Allergen Reactions   Trazodone Shortness Of Breath    Fast heart rate   Lisinopril Cough   Septra Ds [Sulfamethoxazole-Trimethoprim] Nausea Only     Physical Exam: General: The  patient is alert and oriented x3 in no acute distress.  Dermatology: Skin is warm, dry and supple bilateral lower extremities.   Vascular: Palpable pedal pulses bilaterally. Capillary refill within normal limits.  No appreciable edema.  No erythema.  Neurological: Grossly intact via light touch  Musculoskeletal Exam: Hammertoe contracture deformity noted to the second digit.  Pain with palpation range of motion of the second MTP  Radiographic Exam LT foot 10/31/2022:  Normal osseous mineralization. Joint spaces preserved.  No fractures or osseous irregularities noted.  Splay toe deformity noted between the second and third digit of the left foot.  The second metatarsal does extend beyond the normal metatarsal parabola.  Assessment/Plan of Care: 1.  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  X-rays reviewed -Injection of 0.5 cc Celestone Soluspan injected in the second MTP of the left foot -Prescription for a Medrol Dosepak -Prescription for meloxicam 15 mg daily after completion of the Dosepak -Postsurgical shoe dispensed.  WBAT -Return to clinic 4 weeks   Tammy [name], DPM [facility]  Dr. Felecia [name], DPM    [address], [place] [address]  Office [phone]  Fax [phone]

## 2022-11-01 DIAGNOSIS — M7752 Other enthesopathy of left foot: Secondary | ICD-10-CM | POA: Diagnosis not present

## 2022-11-01 MED ORDER — BETAMETHASONE SOD PHOS & ACET 6 (3-3) MG/ML IJ SUSP
3.0000 mg | Freq: Once | INTRAMUSCULAR | Status: AC
Start: 2022-11-01 — End: 2022-11-01
  Administered 2022-11-01: 3 mg via INTRA_ARTICULAR

## 2022-11-10 ENCOUNTER — Telehealth: Payer: Self-pay | Admitting: Podiatry

## 2022-11-10 NOTE — Telephone Encounter (Signed)
Pt called and was seen last week and finished her prednisone and started meloxicam and the pain is coming back after finishing the prednisone. She is taking Aleive at night and tyelonol as needed in the day. Is there anything that she could do to help with the pain. She is going out of town tomorrow morning and will be doing a lot of walking. Please if any rx sent send it to [facility]

## 2022-11-28 ENCOUNTER — Ambulatory Visit: Payer: Medicare Other | Admitting: Podiatry

## 2022-11-28 DIAGNOSIS — M7752 Other enthesopathy of left foot: Secondary | ICD-10-CM

## 2022-11-28 NOTE — Progress Notes (Signed)
Chief Complaint  Patient presents with   Follow-up    4 week follow up capsulitis of metatarsophalangeal joint of left foot. Patient stated since she has been taking gabapentin the pain is better. She is not able to walk long distances without the surgical shoe. She is able to wear slippers around the house.     HPI: 67 y.o. female presenting today for follow-up evaluation of pain and tenderness associated to the left second toe and forefoot.  Overall improvement.  She says the gabapentin also helps alleviate a lot of her symptoms.  Past Medical History:  Diagnosis Date   Abnormal Papanicolaou smear of cervix with positive human papilloma virus (HPV) test 04/2016   Allergic rhinitis, cause unspecified    Carpal tunnel syndrome    Chronic interstitial cystitis    Colon polyps 03/07/2004   Diaphragmatic hernia without mention of obstruction or gangrene    Dysplasia of cervix, unspecified    Esophageal reflux    Essential hypertension, benign    Gastric ulcer, unspecified as acute or chronic, without mention of hemorrhage, perforation, or obstruction    Hiatal hernia    Hypertension    Insomnia, unspecified    Irritable bowel syndrome    Lung nodule 11/27/2011   CXR 11/14/2011-nodule versus osteophyte projecting over thoracic spine on lateral view CT chest 11/24/2011- confirms osteophyte with 2 small nodules right lower lobe and left lower lobe. CT 06/01/12- stable for 5 years.  No further follow-up necessary.    Methicillin resistant Staphylococcus aureus in conditions classified elsewhere and of unspecified site    Mixed incontinence urge and stress (female)(female)    Personal history of colonic polyps    Pure hypercholesterolemia     Past Surgical History:  Procedure Laterality Date   ABDOMINAL HYSTERECTOMY  03/07/2000   Myoma with abnormal uterine bleeding; benign pathology; OVARIES INTACT   CARPAL TUNNEL RELEASE     Bilateral   COLONOSCOPY  03/07/2004   colon polyps single.   [place].   TONSILLECTOMY AND ADENOIDECTOMY      Allergies  Allergen Reactions   Trazodone Shortness Of Breath    Fast heart rate   Lisinopril Cough   Septra Ds [Sulfamethoxazole-Trimethoprim] Nausea Only     Physical Exam: General: The patient is alert and oriented x3 in no acute distress.  Dermatology: Skin is warm, dry and supple bilateral lower extremities.   Vascular: Palpable pedal pulses bilaterally. Capillary refill within normal limits.  No appreciable edema.  No erythema.  Neurological: Grossly intact via light touch  Musculoskeletal Exam: Hammertoe contracture deformity noted to the second digit.  Minimal pain with palpation range of motion of the second MTP  Radiographic Exam LT foot 10/31/2022:  Normal osseous mineralization. Joint spaces preserved.  No fractures or osseous irregularities noted.  Splay toe deformity noted between the second and third digit of the left foot.  The second metatarsal does extend beyond the normal metatarsal parabola.  Assessment/Plan of Care: 1.  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  - Continue meloxicam 15 mg daily as needed - Patient may transition out of the postsurgical shoe into good supportive tennis shoes and sneakers - Return to clinic as needed   Tammy [name], DPM [facility]  Dr. Felecia [name], DPM    [address]  [place], [place] [address]                Office [phone]  Fax [phone]

## 2022-12-28 ENCOUNTER — Telehealth: Payer: Self-pay

## 2022-12-28 NOTE — Telephone Encounter (Signed)
Patient called, requesting a handicap placard. Is this OK? And 3 months or 6 months? Please advise Thanks

## 2022-12-28 NOTE — Telephone Encounter (Signed)
Totally fine! I always do 6 months. -Dr. Logan Bores

## 2023-01-03 NOTE — Telephone Encounter (Signed)
Pt called because she had left a message last week and had not gotten a call back. I did apologized and explained that the nurse was out of the office unexpectedly. I do have Lawanda doing it and told pt I would call when ready but it will more than likely be tomorrow. She said thank you

## 2023-01-04 ENCOUNTER — Other Ambulatory Visit: Payer: Self-pay | Admitting: Podiatry

## 2023-02-01 DIAGNOSIS — S76302A Unspecified injury of muscle, fascia and tendon of the posterior muscle group at thigh level, left thigh, initial encounter: Secondary | ICD-10-CM | POA: Insufficient documentation

## 2023-03-20 ENCOUNTER — Other Ambulatory Visit: Payer: Self-pay | Admitting: Podiatry

## 2023-03-29 ENCOUNTER — Ambulatory Visit: Payer: Medicare Other | Admitting: Podiatry

## 2023-03-29 ENCOUNTER — Encounter: Payer: Self-pay | Admitting: Podiatry

## 2023-03-29 VITALS — Ht 60.0 in | Wt 155.0 lb

## 2023-03-29 DIAGNOSIS — M2042 Other hammer toe(s) (acquired), left foot: Secondary | ICD-10-CM | POA: Diagnosis not present

## 2023-03-29 DIAGNOSIS — M7752 Other enthesopathy of left foot: Secondary | ICD-10-CM

## 2023-03-29 MED ORDER — BETAMETHASONE SOD PHOS & ACET 6 (3-3) MG/ML IJ SUSP
3.0000 mg | Freq: Once | INTRAMUSCULAR | Status: AC
  Administered 2023-03-29: 3 mg via INTRA_ARTICULAR

## 2023-03-29 NOTE — Progress Notes (Signed)
Chief Complaint  Patient presents with   Toe Pain    Pt is here due to left foot toe pain, pt states she would to receive an injection due to pain.    HPI: 68 y.o. female presenting today for acute exacerbation of pain and tenderness associated to the left second toe.  She has been wearing her surgical shoe which helps but she has pain and tenderness.  She says that she knows she would like to have the hammertoe corrected for her at one point but currently she is dealing with other health issues and not a position to pursue surgery  Past Medical History:  Diagnosis Date   Abnormal Papanicolaou smear of cervix with positive human papilloma virus (HPV) test 04/2016   Allergic rhinitis, cause unspecified    Carpal tunnel syndrome    Chronic interstitial cystitis    Colon polyps 03/07/2004   Diaphragmatic hernia without mention of obstruction or gangrene    Dysplasia of cervix, unspecified    Esophageal reflux    Essential hypertension, benign    Gastric ulcer, unspecified as acute or chronic, without mention of hemorrhage, perforation, or obstruction    Hiatal hernia    Hypertension    Insomnia, unspecified    Irritable bowel syndrome    Lung nodule 11/27/2011   CXR 11/14/2011-nodule versus osteophyte projecting over thoracic spine on lateral view CT chest 11/24/2011- confirms osteophyte with 2 small nodules right lower lobe and left lower lobe. CT 06/01/12- stable for 5 years.  No further follow-up necessary.    Methicillin resistant Staphylococcus aureus in conditions classified elsewhere and of unspecified site    Mixed incontinence urge and stress (female)(female)    Personal history of colonic polyps    Pure hypercholesterolemia     Past Surgical History:  Procedure Laterality Date   ABDOMINAL HYSTERECTOMY  03/07/2000   Myoma with abnormal uterine bleeding; benign pathology; OVARIES INTACT   CARPAL TUNNEL RELEASE     Bilateral   COLONOSCOPY  03/07/2004   colon polyps single.   [place].   TONSILLECTOMY AND ADENOIDECTOMY      Allergies  Allergen Reactions   Trazodone Shortness Of Breath    Fast heart rate   Lisinopril Cough   Septra Ds [Sulfamethoxazole-Trimethoprim] Nausea Only     Physical Exam: General: The patient is alert and oriented x3 in no acute distress.  Dermatology: Skin is warm, dry and supple bilateral lower extremities.   Vascular: Palpable pedal pulses bilaterally. Capillary refill within normal limits.  No appreciable edema.  No erythema.  Neurological: Grossly intact via light touch  Musculoskeletal Exam: Hammertoe contracture deformity noted to the second digit.  Tenderness with palpation and range of motion of the second digit of the left foot  Radiographic Exam LT foot 10/31/2022:  Normal osseous mineralization. Joint spaces preserved.  No fractures or osseous irregularities noted.  Splay toe deformity noted between the second and third digit of the left foot.  The second metatarsal does extend beyond the normal metatarsal parabola.  Assessment/Plan of Care: 1.  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  -Injection of 0.5 cc Celestone Soluspan injected into the second MTP left - Continue meloxicam 15 mg daily as needed - Patient has a postsurgical shoe which she says helps alleviate a lot of her pain.  Continue as needed -Today we did discuss surgery to correct for the hammertoe deformity.  Currently the patient is not a position to proceed with surgery at this time.  She would like to have it done once her other health issues are resolved -Return to clinic as needed   Felecia Shelling, DPM [facility]  Dr. Felecia Shelling, DPM    [address] [place], [place] [address]                Office [phone]  Fax [phone]

## 2023-08-02 ENCOUNTER — Other Ambulatory Visit: Payer: Self-pay | Admitting: Podiatry

## 2023-09-25 ENCOUNTER — Ambulatory Visit: Admitting: Podiatry

## 2023-09-26 ENCOUNTER — Encounter: Payer: Self-pay | Admitting: Podiatry

## 2023-09-26 ENCOUNTER — Ambulatory Visit: Admitting: Podiatry

## 2023-09-26 DIAGNOSIS — M7752 Other enthesopathy of left foot: Secondary | ICD-10-CM

## 2023-09-26 DIAGNOSIS — M19072 Primary osteoarthritis, left ankle and foot: Secondary | ICD-10-CM | POA: Diagnosis not present

## 2023-09-26 DIAGNOSIS — I5030 Unspecified diastolic (congestive) heart failure: Secondary | ICD-10-CM | POA: Insufficient documentation

## 2023-09-26 DIAGNOSIS — Z7409 Other reduced mobility: Secondary | ICD-10-CM | POA: Insufficient documentation

## 2023-09-26 DIAGNOSIS — Z79899 Other long term (current) drug therapy: Secondary | ICD-10-CM | POA: Insufficient documentation

## 2023-09-26 DIAGNOSIS — M2042 Other hammer toe(s) (acquired), left foot: Secondary | ICD-10-CM

## 2023-09-26 MED ORDER — TRIAMCINOLONE ACETONIDE 40 MG/ML IJ SUSP
20.0000 mg | Freq: Once | INTRAMUSCULAR | Status: AC
  Administered 2023-09-26: 20 mg

## 2023-09-26 NOTE — Progress Notes (Signed)
 She presents today for follow-up of her hammertoes second left she states that Dr. Janit had given her an injection before writing here she points to the second interspace of the left foot states that she did really well for about 6 months.  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  It is painful and rigidly deformed.  Does not demonstrate any type of cutaneous lesions plantarly.  No open wounds.  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  Plan: Injected the area today around the joint with 10 mg of Celestone  and 5 mg of local anesthetic.  Tolerated procedure well.  Dispensed another handicap placard for 6 months.

## 2023-10-27 ENCOUNTER — Other Ambulatory Visit: Payer: Self-pay | Admitting: Podiatry

## 2024-02-26 ENCOUNTER — Other Ambulatory Visit: Payer: Self-pay | Admitting: Podiatry
